# Patient Record
Sex: Male | Born: 1955 | Race: White | Hispanic: No | Marital: Married | Smoking: Former smoker
Health system: Southern US, Community
[De-identification: ages and names within clinical notes are randomized; demographics above are authoritative.]

## PROBLEM LIST (undated history)

## (undated) DIAGNOSIS — M1990 Unspecified osteoarthritis, unspecified site: Secondary | ICD-10-CM

## (undated) DIAGNOSIS — R0609 Other forms of dyspnea: Secondary | ICD-10-CM

## (undated) DIAGNOSIS — K219 Gastro-esophageal reflux disease without esophagitis: Secondary | ICD-10-CM

## (undated) DIAGNOSIS — G4730 Sleep apnea, unspecified: Secondary | ICD-10-CM

## (undated) DIAGNOSIS — R748 Abnormal levels of other serum enzymes: Secondary | ICD-10-CM

## (undated) DIAGNOSIS — I10 Essential (primary) hypertension: Secondary | ICD-10-CM

## (undated) HISTORY — PX: HAND SURGERY: SHX662

## (undated) HISTORY — PX: HERNIA REPAIR: SHX51

## (undated) HISTORY — PX: SHOULDER SURGERY: SHX246

## (undated) HISTORY — DX: Other forms of dyspnea: R06.09

---

## 1998-08-20 ENCOUNTER — Emergency Department (HOSPITAL_COMMUNITY): Admission: EM | Admit: 1998-08-20 | Discharge: 1998-08-20 | Payer: Self-pay | Admitting: Emergency Medicine

## 1999-07-14 ENCOUNTER — Encounter: Payer: Self-pay | Admitting: Orthopedic Surgery

## 1999-07-14 ENCOUNTER — Ambulatory Visit (HOSPITAL_COMMUNITY): Admission: RE | Admit: 1999-07-14 | Discharge: 1999-07-14 | Payer: Self-pay | Admitting: Orthopedic Surgery

## 1999-07-19 ENCOUNTER — Other Ambulatory Visit: Admission: RE | Admit: 1999-07-19 | Discharge: 1999-07-19 | Payer: Self-pay | Admitting: Orthopedic Surgery

## 2001-04-24 ENCOUNTER — Encounter: Payer: Self-pay | Admitting: Internal Medicine

## 2001-04-24 ENCOUNTER — Encounter: Admission: RE | Admit: 2001-04-24 | Discharge: 2001-04-24 | Payer: Self-pay | Admitting: Internal Medicine

## 2001-11-24 ENCOUNTER — Ambulatory Visit (HOSPITAL_COMMUNITY): Admission: RE | Admit: 2001-11-24 | Discharge: 2001-11-24 | Payer: Self-pay | Admitting: General Surgery

## 2003-09-13 ENCOUNTER — Ambulatory Visit (HOSPITAL_COMMUNITY): Admission: RE | Admit: 2003-09-13 | Discharge: 2003-09-13 | Payer: Self-pay | Admitting: Specialist

## 2004-08-02 ENCOUNTER — Ambulatory Visit (HOSPITAL_BASED_OUTPATIENT_CLINIC_OR_DEPARTMENT_OTHER): Admission: RE | Admit: 2004-08-02 | Discharge: 2004-08-02 | Payer: Self-pay | Admitting: Specialist

## 2009-03-30 ENCOUNTER — Encounter: Admission: RE | Admit: 2009-03-30 | Discharge: 2009-03-30 | Payer: Self-pay | Admitting: Gastroenterology

## 2009-05-09 ENCOUNTER — Ambulatory Visit (HOSPITAL_COMMUNITY): Admission: RE | Admit: 2009-05-09 | Discharge: 2009-05-09 | Payer: Self-pay | Admitting: Gastroenterology

## 2009-05-09 ENCOUNTER — Encounter (INDEPENDENT_AMBULATORY_CARE_PROVIDER_SITE_OTHER): Payer: Self-pay | Admitting: Diagnostic Radiology

## 2010-11-09 LAB — PROTIME-INR
INR: 0.9 (ref 0.00–1.49)
Prothrombin Time: 12.5 seconds (ref 11.6–15.2)

## 2010-11-09 LAB — CBC
HCT: 42.9 % (ref 39.0–52.0)
Hemoglobin: 15.1 g/dL (ref 13.0–17.0)
MCHC: 35.3 g/dL (ref 30.0–36.0)
MCV: 94.8 fL (ref 78.0–100.0)
Platelets: 211 10*3/uL (ref 150–400)
RBC: 4.52 MIL/uL (ref 4.22–5.81)
RDW: 12.6 % (ref 11.5–15.5)
WBC: 5.4 10*3/uL (ref 4.0–10.5)

## 2010-11-09 LAB — APTT: aPTT: 26 seconds (ref 24–37)

## 2010-12-22 NOTE — Op Note (Signed)
[place]. [facility]  Patient:    Blake Diaz, Blake Diaz Visit Number: [id number] MRN: [id number]          Service Type: DSU Location: [facility] 2893 [id number] Attending Physician:  Tempie Donning Dictated by:   Gita Kudo, M.D. Proc. Date: 11/24/01 Admit Date:  11/24/2001 Discharge Date: 11/24/2001   CC:         Verlin Grills, M.D.   Operative Report  PREOPERATIVE DIAGNOSIS:  Recurrent right inguinal hernia.  POSTOPERATIVE DIAGNOSIS:  Recurrent right inguinal hernia.  OPERATION PERFORMED:  Repair of recurrent right inguinal hernia, large complex direct, with preperitoneal and onlay Prolene mesh.  SURGEON:  Gita Kudo, M.D.  ANESTHESIA:  General endotracheal.  INDICATIONS FOR PROCEDURE:  The patient is a 55 year old male admitted for elective repair of a right inguinal hernia.  The hernia was repaired approximately 1994 with the initial surgery being several years earlier.  OPERATIVE FINDINGS:  The patient had a large at least bilobed right inguinal hernia that was direct.  It did have a sac.  The cord contents were actually in the subcutaneous and they were carefully dissected and the vessels and vas preserved.  DESCRIPTION OF PROCEDURE:  Under satisfactory general endotracheal anesthesia, the patients abdomen and genitalia were prepped and draped in a standard fashion.  He received 1.0 gm Ancef IV preop.  The previous incision was opened and carried down to the bulge.  Dissecting carefully through the subcutaneous we identified the cord and vas and dissected then free and encircled them with a Penrose drain.  The external ring was no longer evident but there was a large blow out of the entire floor of the canal.  The hernia sacs were dissected around from healthy fascia and then the floor of the canal opened from the internal ring and the pubic and the preperitoneal fat of both defects was reduced into the abdominal cavity.  The sac  was large, contained omentum and a small hole made in it.  It was felt best not to excise it but I just repaired the hole and inverted the sac along with the other preperitoneal contents.  Then a 3 x 6 inch piece of mesh was tailored into an oval and it was anchored to the Coopers ligament with a 0 Prolene suture and unfolded in the preperitoneal space after the gauze holding the contents away was removed. Then the floor was closed over this with a running 0 Prolene suture taking intermittent bites of the mesh.  The mesh extended from the pubis out laterally beyond the epigastric vessels.  The ends of the suture repairing the floor were left long.  Another piece of 3 x 6 inch mesh was then tailored into an oval.  The external oblique was dissected away from the internal oblique and the inguinal ligament identified.  I did not dissect deeply because there was some adherence and I did not wish to injure any vessels.  I did, however, envision the inguinal ligament just below the curving portion of the internal oblique aponeurosis.  The mesh then had a slit made to go around the cord structures and was anchored at the internal ring.  It was tacked around the periphery to the internal oblique above and the inguinal ligament below and then the tails brought around the cord and sutured to each other and the fascia above and laterally.  The wound was then lavaged with saline and then closed in layers after infiltration with 30  cc of Marcaine.  The external oblique was approximated with a running 2-0 Vicryl suture, deep fascia with interrupted 2-0 Vicryl, subcutaneous with 3-0 Vicryl and skin edges with Steri-Strips.  Sterile dressings were applied and the patient went to the recovery room from the operating room in good condition. Dictated by:   Gita Kudo, M.D. Attending Physician:  Tempie Donning DD:  11/24/01 TD:  11/24/01 Job: [id number]

## 2010-12-22 NOTE — Op Note (Signed)
Blake Diaz, Blake Diaz               ACCOUNT NO.:  [id number]   MEDICAL RECORD NO.:  [id number]          PATIENT TYPE:  AMB   LOCATION:  [facility]                         FACILITY:  [facility]   PHYSICIAN:  Jene Every, M.D.    DATE OF BIRTH:  22-Jun-1956   DATE OF PROCEDURE:  08/02/2004  DATE OF DISCHARGE:                                 OPERATIVE REPORT   PREOPERATIVE DIAGNOSIS:  Rotator cuff tear, acromioclavicular arthrosis,  impingement syndrome of the right shoulder.   POSTOPERATIVE DIAGNOSIS:  Rotator cuff tear, acromioclavicular arthrosis,  impingement syndrome of the right shoulder.   PROCEDURE PERFORMED:  Open acromioplasty, open distal clavicle resection,  open rotator cuff repair.   ANESTHESIA:  General.   ASSISTANT:  Roma Schanz, P.A.   BRIEF HISTORY/INDICATIONS:  A 55 year old with a rotator cuff tear,  impingement syndrome, AC arthrosis.  Operative intervention was indicated  for decompression of the [facility] joint, acromioplasty, repair of the rotator  cuff.  Risks and benefits were discussed, including bleeding, infection,  damage to vascular structures, suboptimal range of motion, need for  revision, postoperative mobilization, etc.   TECHNIQUE:  With the patient supine in the beach-chair position after the  induction of adequate general anesthesia and 1 gm of Kefzol, the right  shoulder and upper extremity was prepped and draped in the usual sterile  fashion.  An incision was made over the anterior aspect of the acromion and  along its lines, the subcutaneous tissue was dissected with electrocautery  utilized to achieve hemostasis.  The raphe between the anterior and lateral  heads of the deltoid was identified and divided, carried over the top of the  acromion, dividing the [facility] joint capsule.  This was then subperiosteally  elevated from the anterior and posterior aspect of the distal clavicle.  A  Bennett retractor was placed anteriorly and posteriorly, beneath the  distal  clavicle.  The oscillating saw was utilized to remove 1 cm of the distal  clavicle, and this was then undercut with a 3 mm Kerrison.  The [facility] joint was  debrided.  The rotator cuff inspected.  There was no evidence of tear, but  impingement was noted.  Following this, the wound was copiously irrigated  and placed bone wax on the distal clavicle.  Next, after we developed the  raphe between the anterior and lateral heads, a large anterior acromial spur  was noted.  The __________ ligament was detached.  The oscillating saw was  utilized and removed with the front lower portion of the acromion.  This was  further contoured with a high-speed bur, converting it into a type I  acromion.  Hypertrophic bursa was noted, and this was excised _________ the  CA ligament.  Inspection of the rotator cuff revealed a full thickness 1 cm  tear in the supraspinatus at its insertion.  This was debrided and excised.  The bone beneath it debrided.  Repaired it side to side with a #1 Vicryl and  figure-of-eight sutures.  No tension on the wound.  Good range of motion.  No impingement.  The remainder of the cuff  was without evidence of tear.  A  bursectomy was performed.  Next, the wound was copiously irrigated.  I  repaired the capsule over the [facility] joint with #1 Vicryl interrupted figure-of-  eight sutures.  Repaired the fascia and the raphe of the deltoid over the  top of the acromion with #1 interrupted figure-of-eight sutures with  excellent repair.  No tension on the wound.  This was after copious  irrigation of the wound.  Subcutaneous tissue was reapproximated with 2-0  Vicryl simple sutures.  Skin was reapproximated with 4-0 subcuticular  Prolene.  The wound was reinforced with Steri-Strips.  Sterile dressing  applied.  Placed in an abduction pillow, extubated without difficulty, and  transported to recovery room in satisfactory condition.  Patient tolerated  the procedure well.  No  complications.     Trey Paula   JB/MEDQ  D:  08/02/2004  T:  08/02/2004  Job:  [id number]

## 2011-08-07 ENCOUNTER — Emergency Department (HOSPITAL_BASED_OUTPATIENT_CLINIC_OR_DEPARTMENT_OTHER)
Admission: EM | Admit: 2011-08-07 | Discharge: 2011-08-07 | Disposition: A | Discharge status: Home / Self Care | Payer: Federal, State, Local not specified - PPO | Attending: Emergency Medicine | Admitting: Emergency Medicine

## 2011-08-07 ENCOUNTER — Encounter: Payer: Self-pay | Admitting: *Deleted

## 2011-08-07 DIAGNOSIS — R197 Diarrhea, unspecified: Secondary | ICD-10-CM | POA: Insufficient documentation

## 2011-08-07 DIAGNOSIS — Z79899 Other long term (current) drug therapy: Secondary | ICD-10-CM | POA: Insufficient documentation

## 2011-08-07 DIAGNOSIS — I10 Essential (primary) hypertension: Secondary | ICD-10-CM | POA: Insufficient documentation

## 2011-08-07 DIAGNOSIS — R1110 Vomiting, unspecified: Secondary | ICD-10-CM | POA: Insufficient documentation

## 2011-08-07 HISTORY — DX: Essential (primary) hypertension: I10

## 2011-08-07 LAB — BASIC METABOLIC PANEL
BUN: 23 mg/dL (ref 6–23)
CO2: 20 mEq/L (ref 19–32)
Calcium: 9.3 mg/dL (ref 8.4–10.5)
Chloride: 101 mEq/L (ref 96–112)
Creatinine, Ser: 0.9 mg/dL (ref 0.50–1.35)
GFR calc Af Amer: >90 mL/min (ref >90)
GFR calc non Af Amer: >90 mL/min (ref >90)
Glucose, Bld: 113 mg/dL — ABNORMAL HIGH (ref 70–99)
Potassium: 3.5 mEq/L (ref 3.5–5.1)
Sodium: 135 mEq/L (ref 135–145)

## 2011-08-07 MED ORDER — SODIUM CHLORIDE 0.9 % IV BOLUS (SEPSIS)
1000.0000 mL | Freq: Once | INTRAVENOUS | Status: AC
  Administered 2011-08-07: 1000 mL via INTRAVENOUS

## 2011-08-07 MED ORDER — PROMETHAZINE HCL 25 MG PO TABS: 25.0000 mg | ORAL_TABLET | Freq: Four times a day (QID) | ORAL | Status: AC | PRN

## 2011-08-07 NOTE — ED Provider Notes (Signed)
History     CSN: [id number]  Arrival date & time 08/07/11  1842   First MD Initiated Contact with Patient 08/07/11 1928      Chief Complaint  Patient presents with  . Emesis    (Consider location/radiation/quality/duration/timing/severity/associated sxs/prior treatment) HPI Comments: Pt states that he has had vomiting and diarrhea since yesterday:pt states that the vomiting is letting up but the diarrhea continues to be bad:pt states that he was seen at urgent care and they sent him over here for concern of dehydration:pt had a normal chest x-ray at the urgent care:pt was given phenergan while there  Patient is a 56 y.o. male presenting with vomiting. The history is provided by the patient. No language interpreter was used.  Emesis  This is a new problem. The current episode started yesterday. The problem occurs 5 to 10 times per day. The problem has not changed since onset.The emesis has an appearance of stomach contents. The maximum temperature recorded prior to his arrival was 100 to 100.9 F. Associated symptoms include abdominal pain, chills, cough, diarrhea and myalgias.    Past Medical History  Diagnosis Date  . Hypertension     Past Surgical History  Procedure Date  . Hernia repair   . Shoulder surgery   . Hand surgery     No family history on file.  History  Substance Use Topics  . Smoking status: Former Games developer  . Smokeless tobacco: Not on file  . Alcohol Use: No      Review of Systems  Constitutional: Positive for chills.  Respiratory: Positive for cough.   Gastrointestinal: Positive for vomiting, abdominal pain and diarrhea.  Musculoskeletal: Positive for myalgias.  All other systems reviewed and are negative.    Allergies  Review of patient's allergies indicates no known allergies.  Home Medications   Current Outpatient Rx  Name Route Sig Dispense Refill  . ACETAMINOPHEN 500 MG PO TABS Oral Take 1,000 mg by mouth every 6 (six) hours as needed.  For pain      . OMEGA-3 FATTY ACIDS 1000 MG PO CAPS Oral Take 1 g by mouth daily.      . IBUPROFEN 200 MG PO TABS Oral Take 400 mg by mouth every 6 (six) hours as needed. For pain      . LISINOPRIL 10 MG PO TABS Oral Take 10 mg by mouth daily.      . ADULT MULTIVITAMIN W/MINERALS CH Oral Take 1 tablet by mouth daily.      Marland Kitchen VITAMIN E 400 UNITS PO CAPS Oral Take 400 Units by mouth daily.        BP 112/70  Pulse 123  Temp(Src) 100.5 F (38.1 C) (Oral)  Resp 20  SpO2 99%  Physical Exam  Nursing note and vitals reviewed. Constitutional: He is oriented to person, place, and time. He appears well-developed and well-nourished.  HENT:  Right Ear: External ear normal.  Left Ear: External ear normal.  Mouth/Throat: Mucous membranes are dry.  Eyes: Conjunctivae and EOM are normal. Pupils are equal, round, and reactive to light.  Neck: Normal range of motion. Neck supple.  Cardiovascular: Normal rate and regular rhythm.   Pulmonary/Chest: Effort normal and breath sounds normal.  Abdominal: Soft. Bowel sounds are normal.  Musculoskeletal: Normal range of motion.  Neurological: He is alert and oriented to person, place, and time.  Skin: Skin is warm and dry.  Psychiatric: He has a normal mood and affect.    ED Course  Procedures (including  critical care time)  Labs Reviewed  BASIC METABOLIC PANEL - Abnormal; Notable for the following:    Glucose, Bld 113 (*)    All other components within normal limits   No results found.   1. Vomiting and diarrhea       MDM  Pt is feeling better after the 2 Liters of fluids:pt is tolerating po here:pt vital stable:symptoms likely viral:will treat for nausea        Teressa Lower, NP 08/07/11 2147

## 2011-08-07 NOTE — ED Notes (Signed)
Vomiting and diarrhea since yesterday am. Was seen earlier at [facility] and sent here for possible dehydration.

## 2011-08-08 NOTE — ED Provider Notes (Signed)
Medical screening examination/treatment/procedure(s) were performed by non-physician practitioner and as supervising physician I was immediately available for consultation/collaboration.   Vida Roller, MD 08/08/11 0300

## 2014-10-26 NOTE — Patient Instructions (Addendum)
[place]  10/26/2014   Your procedure is scheduled on:   11-08-2014 Monday  Enter through [facility]  Entrance and follow signs to [facility]. Arrive at   1100     AM.  Call this number if you have problems the morning of surgery: [phone]  Or Presurgical Testing [phone].   For Living Will and/or Health Care Power Attorney Forms: please provide copy for your medical record,may bring AM of surgery(Forms should be already notarized -we do not provide this service).(10-27-14 Yes/ will provide information on 11-08-14).   For Cpap use: Bring mask and tubing only.   Do not eat food/ or drink: After Midnight.  Exception: may have clear liquids:up to 6 Hours before arrival. Nothing after:  0800 AM.  Clear liquids include soda, tea, black coffee, apple or grape juice, broth.  Take these medicines the morning of surgery with A SIP OF WATER: (please check in With PCP if blood pressure med to be change)   Do not wear jewelry, make-up or nail polish.  Do not wear deodorant, lotions, powders, or perfumes.   Do not shave legs and under arms- 48 hours(2 days) prior to first CHG shower.(Shaving face and neck okay.)  Do not bring valuables to the hospital.(Hospital is not responsible for lost valuables).  Contacts, dentures or removable bridgework, body piercing, hair pins may not be worn into surgery.  Leave suitcase in the car. After surgery it may be brought to your room.  For patients admitted to the hospital, checkout time is 11:00 AM the day of discharge.(Restricted visitors-Any Persons displaying flu-like symptoms or illness).    Patients discharged the day of surgery will not be allowed to drive home. Must have responsible person with you x 24 hours once discharged.  Name and phone number of your driver: Blake Diaz [phone] cell     Please read over the following fact sheets that you were given:  CHG(Chlorhexidine Gluconate 4% Surgical Soap) use, MRSA Information,  Blood Transfusion fact sheet, Incentive Spirometry Instruction.  Remember : Type/Screen "Blue armbands" - may not be removed once applied(would result in being retested AM of surgery, if removed).         [facility] - Preparing for Surgery Before surgery, you can play an important role.  Because skin is not sterile, your skin needs to be as free of germs as possible.  You can reduce the number of germs on your skin by washing with CHG (chlorahexidine gluconate) soap before surgery.  CHG is an antiseptic cleaner which kills germs and bonds with the skin to continue killing germs even after washing. Please DO NOT use if you have an allergy to CHG or antibacterial soaps.  If your skin becomes reddened/irritated stop using the CHG and inform your nurse when you arrive at Short Stay. Do not shave (including legs and underarms) for at least 48 hours prior to the first CHG shower.  You may shave your face/neck. Please follow these instructions carefully:  1.  Shower with CHG Soap the night before surgery and the  morning of Surgery.  2.  If you choose to wash your hair, wash your hair first as usual with your  normal  shampoo.  3.  After you shampoo, rinse your hair and body thoroughly to remove the  shampoo.                           4.  Use  CHG as you would any other liquid soap.  You can apply chg directly  to the skin and wash                       Gently with a scrungie or clean washcloth.  5.  Apply the CHG Soap to your body ONLY FROM THE NECK DOWN.   Do not use on face/ open                           Wound or open sores. Avoid contact with eyes, ears mouth and genitals (private parts).                       Wash face,  Genitals (private parts) with your normal soap.             6.  Wash thoroughly, paying special attention to the area where your surgery  will be performed.  7.  Thoroughly rinse your body with warm water from the neck down.  8.  DO NOT shower/wash with your normal soap after  using and rinsing off  the CHG Soap.                9.  Pat yourself dry with a clean towel.            10.  Wear clean pajamas.            11.  Place clean sheets on your bed the night of your first shower and do not  sleep with pets. Day of Surgery : Do not apply any lotions/deodorants the morning of surgery.  Please wear clean clothes to the hospital/surgery center.  FAILURE TO FOLLOW THESE INSTRUCTIONS MAY RESULT IN THE CANCELLATION OF YOUR SURGERY PATIENT SIGNATURE_________________________________  NURSE SIGNATURE__________________________________  ________________________________________________________________________   Blake Diaz  An incentive spirometer is a tool that can help keep your lungs clear and active. This tool measures how well you are filling your lungs with each breath. Taking long deep breaths may help reverse or decrease the chance of developing breathing (pulmonary) problems (especially infection) following:  A long period of time when you are unable to move or be active. BEFORE THE PROCEDURE   If the spirometer includes an indicator to show your best effort, your nurse or respiratory therapist will set it to a desired goal.  If possible, sit up straight or lean slightly forward. Try not to slouch.  Hold the incentive spirometer in an upright position. INSTRUCTIONS FOR USE   Sit on the edge of your bed if possible, or sit up as far as you can in bed or on a chair.  Hold the incentive spirometer in an upright position.  Breathe out normally.  Place the mouthpiece in your mouth and seal your lips tightly around it.  Breathe in slowly and as deeply as possible, raising the piston or the ball toward the top of the column.  Hold your breath for 3-5 seconds or for as long as possible. Allow the piston or ball to fall to the bottom of the column.  Remove the mouthpiece from your mouth and breathe out normally.  Rest for a few seconds and repeat  Steps 1 through 7 at least 10 times every 1-2 hours when you are awake. Take your time and take a few normal breaths between deep breaths.  The spirometer may include an indicator to show your best  effort. Use the indicator as a goal to work toward during each repetition.  After each set of 10 deep breaths, practice coughing to be sure your lungs are clear. If you have an incision (the cut made at the time of surgery), support your incision when coughing by placing a pillow or rolled up towels firmly against it. Once you are able to get out of bed, walk around indoors and cough well. You may stop using the incentive spirometer when instructed by your caregiver.  RISKS AND COMPLICATIONS  Take your time so you do not get dizzy or light-headed.  If you are in pain, you may need to take or ask for pain medication before doing incentive spirometry. It is harder to take a deep breath if you are having pain. AFTER USE  Rest and breathe slowly and easily.  It can be helpful to keep track of a log of your progress. Your caregiver can provide you with a simple table to help with this. If you are using the spirometer at home, follow these instructions: [place] IF:   You are having difficultly using the spirometer.  You have trouble using the spirometer as often as instructed.  Your pain medication is not giving enough relief while using the spirometer.  You develop fever of 100.5 F (38.1 C) or higher. SEEK IMMEDIATE MEDICAL CARE IF:   You cough up bloody sputum that had not been present before.  You develop fever of 102 F (38.9 C) or greater.  You develop worsening pain at or near the incision site. MAKE SURE YOU:   Understand these instructions.  Will watch your condition.  Will get help right away if you are not doing well or get worse. Document Released: 12/03/2006 Document Revised: 10/15/2011 Document Reviewed: 02/03/2007 ExitCare Patient Information 2014 ExitCare,  [place].   ________________________________________________________________________  WHAT IS A BLOOD TRANSFUSION? Blood Transfusion Information  A transfusion is the replacement of blood or some of its parts. Blood is made up of multiple cells which provide different functions.  Red blood cells carry oxygen and are used for blood loss replacement.  Lieder blood cells fight against infection.  Platelets control bleeding.  Plasma helps clot blood.  Other blood products are available for specialized needs, such as hemophilia or other clotting disorders. BEFORE THE TRANSFUSION  Who gives blood for transfusions?   Healthy volunteers who are fully evaluated to make sure their blood is safe. This is blood bank blood. Transfusion therapy is the safest it has ever been in the practice of medicine. Before blood is taken from a donor, a complete history is taken to make sure that person has no history of diseases nor engages in risky social behavior (examples are intravenous drug use or sexual activity with multiple partners). The donor's travel history is screened to minimize risk of transmitting infections, such as malaria. The donated blood is tested for signs of infectious diseases, such as HIV and hepatitis. The blood is then tested to be sure it is compatible with you in order to minimize the chance of a transfusion reaction. If you or a relative donates blood, this is often done in anticipation of surgery and is not appropriate for emergency situations. It takes many days to process the donated blood. RISKS AND COMPLICATIONS Although transfusion therapy is very safe and saves many lives, the main dangers of transfusion include:   Getting an infectious disease.  Developing a transfusion reaction. This is an allergic reaction to something in the  blood you were given. Every precaution is taken to prevent this. The decision to have a blood transfusion has been considered carefully by your caregiver  before blood is given. Blood is not given unless the benefits outweigh the risks. AFTER THE TRANSFUSION  Right after receiving a blood transfusion, you will usually feel much better and more energetic. This is especially true if your red blood cells have gotten low (anemic). The transfusion raises the level of the red blood cells which carry oxygen, and this usually causes an energy increase.  The nurse administering the transfusion will monitor you carefully for complications. HOME CARE INSTRUCTIONS  No special instructions are needed after a transfusion. You may find your energy is better. Speak with your caregiver about any limitations on activity for underlying diseases you may have. SEEK MEDICAL CARE IF:   Your condition is not improving after your transfusion.  You develop redness or irritation at the intravenous (IV) site. SEEK IMMEDIATE MEDICAL CARE IF:  Any of the following symptoms occur over the next 12 hours:  Shaking chills.  You have a temperature by mouth above 102 F (38.9 C), not controlled by medicine.  Chest, back, or muscle pain.  People around you feel you are not acting correctly or are confused.  Shortness of breath or difficulty breathing.  Dizziness and fainting.  You get a rash or develop hives.  You have a decrease in urine output.  Your urine turns a dark color or changes to pink, red, or brown. Any of the following symptoms occur over the next 10 days:  You have a temperature by mouth above 102 F (38.9 C), not controlled by medicine.  Shortness of breath.  Weakness after normal activity.  The [place] part of the eye turns yellow (jaundice).  You have a decrease in the amount of urine or are urinating less often.  Your urine turns a dark color or changes to pink, red, or brown. Document Released: 07/20/2000 Document Revised: 10/15/2011 Document Reviewed: 03/08/2008 [facility] Patient Information 2014 [place],  [place].  _______________________________________________________________________

## 2014-10-26 NOTE — Progress Notes (Signed)
Please put orders in Epic surgery 11-08-14 pre op 10-27-14 Thanks

## 2014-10-27 ENCOUNTER — Encounter (HOSPITAL_COMMUNITY)
Admission: RE | Admit: 2014-10-27 | Discharge: 2014-10-27 | Disposition: A | Discharge status: Home / Self Care | Payer: Federal, State, Local not specified - PPO | Source: Ambulatory Visit | Attending: Orthopedic Surgery | Admitting: Orthopedic Surgery

## 2014-10-27 ENCOUNTER — Encounter (HOSPITAL_COMMUNITY): Payer: Self-pay

## 2014-10-27 DIAGNOSIS — Z01810 Encounter for preprocedural cardiovascular examination: Secondary | ICD-10-CM | POA: Insufficient documentation

## 2014-10-27 DIAGNOSIS — Z01812 Encounter for preprocedural laboratory examination: Secondary | ICD-10-CM | POA: Insufficient documentation

## 2014-10-27 HISTORY — DX: Abnormal levels of other serum enzymes: R74.8

## 2014-10-27 HISTORY — DX: Sleep apnea, unspecified: G47.30

## 2014-10-27 HISTORY — DX: Gastro-esophageal reflux disease without esophagitis: K21.9

## 2014-10-27 HISTORY — DX: Unspecified osteoarthritis, unspecified site: M19.90

## 2014-10-27 LAB — COMPREHENSIVE METABOLIC PANEL
ALT: 54 U/L — ABNORMAL HIGH (ref 0–53)
AST: 38 U/L — ABNORMAL HIGH (ref 0–37)
Albumin: 4.7 g/dL (ref 3.5–5.2)
Alkaline Phosphatase: 61 U/L (ref 39–117)
Anion gap: 8 (ref 5–15)
BUN: 9 mg/dL (ref 6–23)
CO2: 26 mmol/L (ref 19–32)
Calcium: 9.5 mg/dL (ref 8.4–10.5)
Chloride: 105 mmol/L (ref 96–112)
Creatinine, Ser: 0.78 mg/dL (ref 0.50–1.35)
GFR calc Af Amer: >90 mL/min (ref >90)
GFR calc non Af Amer: >90 mL/min (ref >90)
Glucose, Bld: 104 mg/dL — ABNORMAL HIGH (ref 70–99)
Potassium: 4.1 mmol/L (ref 3.5–5.1)
Sodium: 139 mmol/L (ref 135–145)
Total Bilirubin: 1.1 mg/dL (ref 0.3–1.2)
Total Protein: 7.5 g/dL (ref 6.0–8.3)

## 2014-10-27 LAB — SURGICAL PCR SCREEN
MRSA, PCR: NEGATIVE
Staphylococcus aureus: NEGATIVE

## 2014-10-27 LAB — CBC
HCT: 43.6 % (ref 39.0–52.0)
Hemoglobin: 15.2 g/dL (ref 13.0–17.0)
MCH: 32.6 pg (ref 26.0–34.0)
MCHC: 34.9 g/dL (ref 30.0–36.0)
MCV: 93.6 fL (ref 78.0–100.0)
Platelets: 245 10*3/uL (ref 150–400)
RBC: 4.66 MIL/uL (ref 4.22–5.81)
RDW: 12.3 % (ref 11.5–15.5)
WBC: 6.5 10*3/uL (ref 4.0–10.5)

## 2014-10-27 LAB — PROTIME-INR
INR: 0.97 (ref 0.00–1.49)
Prothrombin Time: 13 seconds (ref 11.6–15.2)

## 2014-10-27 LAB — APTT: aPTT: 30 seconds (ref 24–37)

## 2014-10-27 NOTE — Pre-Procedure Instructions (Signed)
10-27-14 Pt. Not sure whether he wants to continue present blood pressure med, instructed to call PCP to discuss med use, inform me if there is a change in med.

## 2014-10-27 NOTE — Progress Notes (Signed)
10-27-14 Need MD order entry in [place] here now for PAT appointment.

## 2014-11-03 NOTE — H&P (Signed)
TOTAL KNEE ADMISSION H&P  Patient is being admitted for right total knee arthroplasty.  Subjective:  Chief Complaint:    Right knee primary OA / pain.  HPI: KENTRELL HALLAHAN, 59 y.o. male, has a history of pain and functional disability in the right knee due to arthritis and has failed non-surgical conservative treatments for greater than 12 weeks to include NSAID's and/or analgesics, corticosteriod injections, viscosupplementation injections and activity modification.  Onset of symptoms was gradual, starting years ago with gradually worsening course since that time. The patient noted no past surgery on the right knee(s).  Patient currently rates pain in the right knee(s) at 8 out of 10 with activity. Patient has night pain, worsening of pain with activity and weight bearing, pain that interferes with activities of daily living, pain with passive range of motion, crepitus and joint swelling.  Patient has evidence of periarticular osteophytes and joint space narrowing by imaging studies.  There is no active infection.  Risks, benefits and expectations were discussed with the patient.  Risks including but not limited to the risk of anesthesia, blood clots, nerve damage, blood vessel damage, failure of the prosthesis, infection and up to and including death.  Patient understand the risks, benefits and expectations and wishes to proceed with surgery.   PCP: No PCP Per Patient  D/C Plans:     Home with HHPT  Post-op Meds:       No Rx given   Tranexamic Acid:      To be given - IV    Decadron:      is to be given  FYI:     ASA post-op  Norco post-op   CPAP   Past Medical History  Diagnosis Date  . Hypertension     presently taking Lisinopril sparingly  . Sleep apnea     nasal prongs, cpap used occ.  Marland Kitchen GERD (gastroesophageal reflux disease)     acid reflux uses OTS "TUMS"  . Arthritis     osteoarthritis right knee  . Elevated liver enzymes     history of "never any continued follow  up"-"not a problem"    Past Surgical History  Procedure Laterality Date  . Shoulder surgery Right     open -bone spur and ligament tears"some limited ROM"  . Hand surgery Left     trigger finger  . Hernia repair      Umbilical, bilateral inguinal hernis repair    No prescriptions prior to admission   No Known Allergies   History  Substance Use Topics  . Smoking status: Former Smoker    Types: Cigarettes    Quit date: 10/27/1994  . Smokeless tobacco: Not on file  . Alcohol Use: No    No family history on file.   Review of Systems  Constitutional: Negative.   HENT: Negative.   Eyes: Negative.   Respiratory: Negative.   Cardiovascular: Negative.   Gastrointestinal: Positive for heartburn.  Genitourinary: Negative.   Musculoskeletal: Positive for joint pain.  Skin: Negative.   Neurological: Negative.   Endo/Heme/Allergies: Negative.   Psychiatric/Behavioral: Negative.     Objective:  Physical Exam  Constitutional: He is oriented to person, place, and time. He appears well-developed and well-nourished.  HENT:  Head: Normocephalic.  Neck: Neck supple. No JVD present. No tracheal deviation present. No thyromegaly present.  Cardiovascular: Normal rate, regular rhythm, normal heart sounds and intact distal pulses.   Respiratory: Effort normal and breath sounds normal. No stridor. No respiratory distress. He has  no wheezes.  GI: Soft. There is no tenderness. There is no guarding.  Musculoskeletal:       Right knee: He exhibits decreased range of motion, swelling and bony tenderness. He exhibits no ecchymosis, no deformity, no laceration and no erythema. Tenderness found.  Lymphadenopathy:    He has no cervical adenopathy.  Neurological: He is alert and oriented to person, place, and time.  Skin: Skin is warm and dry.  Psychiatric: He has a normal mood and affect.      Imaging Review Plain radiographs demonstrate severe degenerative joint disease of the right  knee(s). The overall alignment is neutral. The bone quality appears to be good for age and reported activity level.  Assessment/Plan:  End stage arthritis, right knee   The patient history, physical examination, clinical judgment of the provider and imaging studies are consistent with end stage degenerative joint disease of the right knee(s) and total knee arthroplasty is deemed medically necessary. The treatment options including medical management, injection therapy arthroscopy and arthroplasty were discussed at length. The risks and benefits of total knee arthroplasty were presented and reviewed. The risks due to aseptic loosening, infection, stiffness, patella tracking problems, thromboembolic complications and other imponderables were discussed. The patient acknowledged the explanation, agreed to proceed with the plan and consent was signed. Patient is being admitted for inpatient treatment for surgery, pain control, PT, OT, prophylactic antibiotics, VTE prophylaxis, progressive ambulation and ADL's and discharge planning. The patient is planning to be discharged home with home health services.     [place] Sarina Robleto   PA-C  11/03/2014, 9:33 PM

## 2014-11-08 ENCOUNTER — Encounter (HOSPITAL_COMMUNITY): Payer: Self-pay | Admitting: *Deleted

## 2014-11-08 ENCOUNTER — Encounter (HOSPITAL_COMMUNITY)
Admission: RE | Disposition: A | Discharge status: Home Health | Payer: Self-pay | Source: Ambulatory Visit | Attending: Orthopedic Surgery

## 2014-11-08 ENCOUNTER — Inpatient Hospital Stay (HOSPITAL_COMMUNITY): Payer: Federal, State, Local not specified - PPO | Admitting: Anesthesiology

## 2014-11-08 ENCOUNTER — Inpatient Hospital Stay (HOSPITAL_COMMUNITY)
Admission: RE | Admit: 2014-11-08 | Discharge: 2014-11-09 | DRG: 470 | Disposition: A | Discharge status: Home Health | Payer: Federal, State, Local not specified - PPO | Source: Ambulatory Visit | Attending: Orthopedic Surgery | Admitting: Orthopedic Surgery

## 2014-11-08 DIAGNOSIS — M25561 Pain in right knee: Secondary | ICD-10-CM | POA: Diagnosis present

## 2014-11-08 DIAGNOSIS — G4730 Sleep apnea, unspecified: Secondary | ICD-10-CM | POA: Diagnosis present

## 2014-11-08 DIAGNOSIS — K219 Gastro-esophageal reflux disease without esophagitis: Secondary | ICD-10-CM | POA: Diagnosis present

## 2014-11-08 DIAGNOSIS — I10 Essential (primary) hypertension: Secondary | ICD-10-CM | POA: Diagnosis present

## 2014-11-08 DIAGNOSIS — M1711 Unilateral primary osteoarthritis, right knee: Secondary | ICD-10-CM | POA: Diagnosis present

## 2014-11-08 DIAGNOSIS — Z96651 Presence of right artificial knee joint: Secondary | ICD-10-CM

## 2014-11-08 DIAGNOSIS — Z01812 Encounter for preprocedural laboratory examination: Secondary | ICD-10-CM | POA: Diagnosis not present

## 2014-11-08 DIAGNOSIS — M659 Synovitis and tenosynovitis, unspecified: Secondary | ICD-10-CM | POA: Diagnosis present

## 2014-11-08 DIAGNOSIS — Z96659 Presence of unspecified artificial knee joint: Secondary | ICD-10-CM

## 2014-11-08 DIAGNOSIS — Z87891 Personal history of nicotine dependence: Secondary | ICD-10-CM | POA: Diagnosis not present

## 2014-11-08 HISTORY — PX: TOTAL KNEE ARTHROPLASTY: SHX125

## 2014-11-08 LAB — TYPE AND SCREEN
ABO/RH(D): O POS
Antibody Screen: NEGATIVE

## 2014-11-08 LAB — URINALYSIS, ROUTINE W REFLEX MICROSCOPIC
Bilirubin Urine: NEGATIVE
Glucose, UA: NEGATIVE mg/dL
Hgb urine dipstick: NEGATIVE
Ketones, ur: NEGATIVE mg/dL
Nitrite: NEGATIVE
Protein, ur: NEGATIVE mg/dL
Specific Gravity, Urine: 1.024 (ref 1.005–1.030)
Urobilinogen, UA: 1 mg/dL (ref 0.0–1.0)
pH: 7 (ref 5.0–8.0)

## 2014-11-08 LAB — URINE MICROSCOPIC-ADD ON

## 2014-11-08 LAB — ABO/RH: ABO/RH(D): O POS

## 2014-11-08 SURGERY — ARTHROPLASTY, KNEE, TOTAL
Anesthesia: Spinal | Site: Knee | Laterality: Right

## 2014-11-08 MED ORDER — HYDROMORPHONE HCL 1 MG/ML IJ SOLN: 0.2500 mg | INTRAMUSCULAR | Status: DC | PRN

## 2014-11-08 MED ORDER — CHLORHEXIDINE GLUCONATE 4 % EX LIQD: 60.0000 mL | Freq: Once | CUTANEOUS | Status: DC

## 2014-11-08 MED ORDER — FERROUS SULFATE 325 (65 FE) MG PO TABS
325.0000 mg | ORAL_TABLET | Freq: Three times a day (TID) | ORAL | Status: DC
  Administered 2014-11-08 – 2014-11-09 (×2): 325 mg via ORAL
  Filled 2014-11-08 (×5): qty 1

## 2014-11-08 MED ORDER — METOCLOPRAMIDE HCL 10 MG PO TABS
5.0000 mg | ORAL_TABLET | Freq: Three times a day (TID) | ORAL | Status: DC | PRN
Start: 2014-11-08 — End: 2014-11-09

## 2014-11-08 MED ORDER — PHENYLEPHRINE 40 MCG/ML (10ML) SYRINGE FOR IV PUSH (FOR BLOOD PRESSURE SUPPORT)
PREFILLED_SYRINGE | INTRAVENOUS | Status: AC
  Filled 2014-11-08: qty 10

## 2014-11-08 MED ORDER — LACTATED RINGERS IV SOLN
INTRAVENOUS | Status: DC
  Administered 2014-11-08 (×2): via INTRAVENOUS
  Administered 2014-11-08: 1000 mL via INTRAVENOUS
  Administered 2014-11-08: 15:00:00 via INTRAVENOUS

## 2014-11-08 MED ORDER — SODIUM CHLORIDE 0.9 % IR SOLN
Status: DC | PRN
  Administered 2014-11-08: 1000 mL

## 2014-11-08 MED ORDER — SODIUM CHLORIDE 0.9 % IV SOLN
1000.0000 mg | Freq: Once | INTRAVENOUS | Status: AC
  Administered 2014-11-08: 1000 mg via INTRAVENOUS
  Filled 2014-11-08: qty 10

## 2014-11-08 MED ORDER — HYDROCODONE-ACETAMINOPHEN 7.5-325 MG PO TABS
1.0000 | ORAL_TABLET | ORAL | Status: DC
Start: 2014-11-08 — End: 2014-11-09
  Administered 2014-11-08 (×2): 1 via ORAL
  Administered 2014-11-09 (×3): 2 via ORAL
  Filled 2014-11-08 (×3): qty 2
  Filled 2014-11-08 (×2): qty 1

## 2014-11-08 MED ORDER — CEFAZOLIN SODIUM-DEXTROSE 2-3 GM-% IV SOLR
2.0000 g | INTRAVENOUS | Status: AC
  Administered 2014-11-08: 2 g via INTRAVENOUS

## 2014-11-08 MED ORDER — LACTATED RINGERS IV SOLN: INTRAVENOUS | Status: DC

## 2014-11-08 MED ORDER — POLYETHYLENE GLYCOL 3350 17 G PO PACK
17.0000 g | PACK | Freq: Two times a day (BID) | ORAL | Status: DC
  Administered 2014-11-08 – 2014-11-09 (×2): 17 g via ORAL

## 2014-11-08 MED ORDER — FENTANYL CITRATE 0.05 MG/ML IJ SOLN
INTRAMUSCULAR | Status: DC | PRN
  Administered 2014-11-08: 100 ug via INTRAVENOUS

## 2014-11-08 MED ORDER — HYDROMORPHONE HCL 1 MG/ML IJ SOLN
0.5000 mg | INTRAMUSCULAR | Status: DC | PRN
  Administered 2014-11-08: 1 mg via INTRAVENOUS
  Filled 2014-11-08: qty 1

## 2014-11-08 MED ORDER — METHOCARBAMOL 500 MG PO TABS: 500.0000 mg | ORAL_TABLET | Freq: Four times a day (QID) | ORAL | Status: DC | PRN

## 2014-11-08 MED ORDER — MIDAZOLAM HCL 2 MG/2ML IJ SOLN
INTRAMUSCULAR | Status: AC
  Filled 2014-11-08: qty 2

## 2014-11-08 MED ORDER — CELECOXIB 200 MG PO CAPS
200.0000 mg | ORAL_CAPSULE | Freq: Two times a day (BID) | ORAL | Status: DC
  Administered 2014-11-08 – 2014-11-09 (×2): 200 mg via ORAL
  Filled 2014-11-08 (×2): qty 1

## 2014-11-08 MED ORDER — CEFAZOLIN SODIUM-DEXTROSE 2-3 GM-% IV SOLR
2.0000 g | Freq: Four times a day (QID) | INTRAVENOUS | Status: AC
  Administered 2014-11-08 – 2014-11-09 (×2): 2 g via INTRAVENOUS
  Filled 2014-11-08 (×2): qty 50

## 2014-11-08 MED ORDER — SODIUM CHLORIDE 0.9 % IJ SOLN
INTRAMUSCULAR | Status: DC | PRN
  Administered 2014-11-08 (×2): 30 mL

## 2014-11-08 MED ORDER — BISACODYL 10 MG RE SUPP: 10.0000 mg | Freq: Every day | RECTAL | Status: DC | PRN

## 2014-11-08 MED ORDER — PROPOFOL 10 MG/ML IV BOLUS
INTRAVENOUS | Status: AC
  Filled 2014-11-08: qty 20

## 2014-11-08 MED ORDER — ONDANSETRON HCL 4 MG PO TABS
4.0000 mg | ORAL_TABLET | Freq: Four times a day (QID) | ORAL | Status: DC | PRN
Start: 2014-11-08 — End: 2014-11-09

## 2014-11-08 MED ORDER — PHENYLEPHRINE HCL 10 MG/ML IJ SOLN
INTRAMUSCULAR | Status: DC | PRN
  Administered 2014-11-08: 80 ug via INTRAVENOUS

## 2014-11-08 MED ORDER — ASPIRIN EC 325 MG PO TBEC
325.0000 mg | DELAYED_RELEASE_TABLET | Freq: Two times a day (BID) | ORAL | Status: DC
  Administered 2014-11-09: 325 mg via ORAL
  Filled 2014-11-08 (×3): qty 1

## 2014-11-08 MED ORDER — DIPHENHYDRAMINE HCL 25 MG PO CAPS: 25.0000 mg | ORAL_CAPSULE | Freq: Four times a day (QID) | ORAL | Status: DC | PRN

## 2014-11-08 MED ORDER — BUPIVACAINE IN DEXTROSE 0.75-8.25 % IT SOLN
INTRATHECAL | Status: DC | PRN
  Administered 2014-11-08: 2 mL via INTRATHECAL

## 2014-11-08 MED ORDER — MIDAZOLAM HCL 5 MG/5ML IJ SOLN
INTRAMUSCULAR | Status: DC | PRN
  Administered 2014-11-08: 2 mg via INTRAVENOUS

## 2014-11-08 MED ORDER — ALUM & MAG HYDROXIDE-SIMETH 200-200-20 MG/5ML PO SUSP: 30.0000 mL | ORAL | Status: DC | PRN

## 2014-11-08 MED ORDER — 0.9 % SODIUM CHLORIDE (POUR BTL) OPTIME
TOPICAL | Status: DC | PRN
  Administered 2014-11-08: 1000 mL

## 2014-11-08 MED ORDER — KETOROLAC TROMETHAMINE 30 MG/ML IJ SOLN
INTRAMUSCULAR | Status: DC | PRN
  Administered 2014-11-08 (×2): 30 mg

## 2014-11-08 MED ORDER — METOCLOPRAMIDE HCL 5 MG/ML IJ SOLN: 5.0000 mg | Freq: Three times a day (TID) | INTRAMUSCULAR | Status: DC | PRN

## 2014-11-08 MED ORDER — PROPOFOL INFUSION 10 MG/ML OPTIME
INTRAVENOUS | Status: DC | PRN
  Administered 2014-11-08: 100 ug/kg/min via INTRAVENOUS

## 2014-11-08 MED ORDER — DEXTROSE 5 % IV SOLN
500.0000 mg | Freq: Four times a day (QID) | INTRAVENOUS | Status: DC | PRN
  Filled 2014-11-08: qty 5

## 2014-11-08 MED ORDER — DEXAMETHASONE SODIUM PHOSPHATE 10 MG/ML IJ SOLN: 10.0000 mg | Freq: Once | INTRAMUSCULAR | Status: DC

## 2014-11-08 MED ORDER — FENTANYL CITRATE 0.05 MG/ML IJ SOLN
INTRAMUSCULAR | Status: AC
  Filled 2014-11-08: qty 2

## 2014-11-08 MED ORDER — MENTHOL 3 MG MT LOZG: 1.0000 | LOZENGE | OROMUCOSAL | Status: DC | PRN

## 2014-11-08 MED ORDER — PHENOL 1.4 % MT LIQD: 1.0000 | OROMUCOSAL | Status: DC | PRN

## 2014-11-08 MED ORDER — SODIUM CHLORIDE 0.9 % IJ SOLN
INTRAMUSCULAR | Status: AC
  Filled 2014-11-08: qty 50

## 2014-11-08 MED ORDER — KETOROLAC TROMETHAMINE 30 MG/ML IJ SOLN
INTRAMUSCULAR | Status: AC
  Filled 2014-11-08: qty 1

## 2014-11-08 MED ORDER — MAGNESIUM CITRATE PO SOLN: 1.0000 | Freq: Once | ORAL | Status: AC | PRN

## 2014-11-08 MED ORDER — DOCUSATE SODIUM 100 MG PO CAPS
100.0000 mg | ORAL_CAPSULE | Freq: Two times a day (BID) | ORAL | Status: DC
  Administered 2014-11-08 – 2014-11-09 (×2): 100 mg via ORAL

## 2014-11-08 MED ORDER — ONDANSETRON HCL 4 MG/2ML IJ SOLN: 4.0000 mg | Freq: Four times a day (QID) | INTRAMUSCULAR | Status: DC | PRN

## 2014-11-08 MED ORDER — BUPIVACAINE-EPINEPHRINE (PF) 0.25% -1:200000 IJ SOLN
INTRAMUSCULAR | Status: DC | PRN
  Administered 2014-11-08 (×2): 30 mL

## 2014-11-08 MED ORDER — DEXAMETHASONE SODIUM PHOSPHATE 10 MG/ML IJ SOLN
10.0000 mg | Freq: Once | INTRAMUSCULAR | Status: AC
  Administered 2014-11-09: 10 mg via INTRAVENOUS
  Filled 2014-11-08: qty 1

## 2014-11-08 MED ORDER — CEFAZOLIN SODIUM-DEXTROSE 2-3 GM-% IV SOLR
INTRAVENOUS | Status: AC
  Filled 2014-11-08: qty 50

## 2014-11-08 MED ORDER — SODIUM CHLORIDE 0.9 % IV SOLN
INTRAVENOUS | Status: DC
  Administered 2014-11-08: 17:00:00 via INTRAVENOUS
  Filled 2014-11-08 (×7): qty 1000

## 2014-11-08 MED ORDER — BUPIVACAINE-EPINEPHRINE (PF) 0.25% -1:200000 IJ SOLN
INTRAMUSCULAR | Status: AC
  Filled 2014-11-08: qty 30

## 2014-11-08 SURGICAL SUPPLY — 53 items
BAG DECANTER FOR FLEXI CONT (MISCELLANEOUS) IMPLANT
BAG ZIPLOCK 12X15 (MISCELLANEOUS) IMPLANT
BANDAGE ELASTIC 6 VELCRO ST LF (GAUZE/BANDAGES/DRESSINGS) ×2 IMPLANT
BANDAGE ESMARK 6X9 LF (GAUZE/BANDAGES/DRESSINGS) ×1 IMPLANT
BLADE SAW SGTL 13.0X1.19X90.0M (BLADE) ×2 IMPLANT
BNDG ESMARK 6X9 LF (GAUZE/BANDAGES/DRESSINGS) ×2
BOWL SMART MIX CTS (DISPOSABLE) ×2 IMPLANT
CAPT KNEE TOTAL 3 ATTUNE ×2 IMPLANT
CEMENT HV SMART SET (Cement) ×4 IMPLANT
CUFF TOURN SGL QUICK 34 (TOURNIQUET CUFF) ×1
CUFF TRNQT CYL 34X4X40X1 (TOURNIQUET CUFF) ×1 IMPLANT
DECANTER SPIKE VIAL GLASS SM (MISCELLANEOUS) ×2 IMPLANT
DRAPE EXTREMITY T 121X128X90 (DRAPE) ×2 IMPLANT
DRAPE POUCH INSTRU U-SHP 10X18 (DRAPES) ×2 IMPLANT
DRAPE U-SHAPE 47X51 STRL (DRAPES) ×2 IMPLANT
DRSG AQUACEL AG ADV 3.5X10 (GAUZE/BANDAGES/DRESSINGS) ×2 IMPLANT
DURAPREP 26ML APPLICATOR (WOUND CARE) ×4 IMPLANT
ELECT REM PT RETURN 9FT ADLT (ELECTROSURGICAL) ×2
ELECTRODE REM PT RTRN 9FT ADLT (ELECTROSURGICAL) ×1 IMPLANT
FACESHIELD WRAPAROUND (MASK) ×10 IMPLANT
GLOVE BIOGEL PI IND STRL 7.5 (GLOVE) ×1 IMPLANT
GLOVE BIOGEL PI IND STRL 8.5 (GLOVE) ×1 IMPLANT
GLOVE BIOGEL PI INDICATOR 7.5 (GLOVE) ×1
GLOVE BIOGEL PI INDICATOR 8.5 (GLOVE) ×1
GLOVE ECLIPSE 8.0 STRL XLNG CF (GLOVE) ×2 IMPLANT
GLOVE ORTHO TXT STRL SZ7.5 (GLOVE) ×4 IMPLANT
GOWN SPEC L3 XXLG W/TWL (GOWN DISPOSABLE) ×2 IMPLANT
GOWN STRL REUS W/TWL LRG LVL3 (GOWN DISPOSABLE) ×2 IMPLANT
HANDPIECE INTERPULSE COAX TIP (DISPOSABLE) ×1
KIT BASIN OR (CUSTOM PROCEDURE TRAY) ×2 IMPLANT
LIQUID BAND (GAUZE/BANDAGES/DRESSINGS) ×2 IMPLANT
MANIFOLD NEPTUNE II (INSTRUMENTS) ×2 IMPLANT
NDL SAFETY ECLIPSE 18X1.5 (NEEDLE) ×1 IMPLANT
NEEDLE HYPO 18GX1.5 SHARP (NEEDLE) ×1
PACK TOTAL JOINT (CUSTOM PROCEDURE TRAY) ×2 IMPLANT
PEN SKIN MARKING BROAD (MISCELLANEOUS) ×2 IMPLANT
POSITIONER SURGICAL ARM (MISCELLANEOUS) ×2 IMPLANT
SET HNDPC FAN SPRY TIP SCT (DISPOSABLE) ×1 IMPLANT
SET PAD KNEE POSITIONER (MISCELLANEOUS) ×2 IMPLANT
SUCTION FRAZIER 12FR DISP (SUCTIONS) ×2 IMPLANT
SUT MNCRL AB 4-0 PS2 18 (SUTURE) ×2 IMPLANT
SUT VIC AB 1 CT1 36 (SUTURE) ×2 IMPLANT
SUT VIC AB 2-0 CT1 27 (SUTURE) ×3
SUT VIC AB 2-0 CT1 TAPERPNT 27 (SUTURE) ×3 IMPLANT
SUT VLOC 180 0 24IN GS25 (SUTURE) ×2 IMPLANT
SYR 50ML LL SCALE MARK (SYRINGE) ×2 IMPLANT
TOWEL OR 17X26 10 PK STRL BLUE (TOWEL DISPOSABLE) ×2 IMPLANT
TOWEL OR NON WOVEN STRL DISP B (DISPOSABLE) IMPLANT
TRAY FOLEY CATH 14FRSI W/METER (CATHETERS) IMPLANT
TRAY FOLEY CATH 16FR SILVER (SET/KITS/TRAYS/PACK) ×2 IMPLANT
WATER STERILE IRR 1500ML POUR (IV SOLUTION) ×2 IMPLANT
WRAP KNEE MAXI GEL POST OP (GAUZE/BANDAGES/DRESSINGS) ×2 IMPLANT
YANKAUER SUCT BULB TIP 10FT TU (MISCELLANEOUS) ×2 IMPLANT

## 2014-11-08 NOTE — Anesthesia Procedure Notes (Signed)
Spinal Patient location during procedure: OR Start time: 11/08/2014 1:41 PM Staffing Anesthesiologist: Rod Mae Resident/CRNA: [place] Domino Holten C Performed by: resident/CRNA  Preanesthetic Checklist Completed: patient identified, site marked, surgical consent, pre-op evaluation, timeout performed, IV checked, risks and benefits discussed and monitors and equipment checked Spinal Block Patient position: sitting Prep: Betadine and site prepped and draped Patient monitoring: heart rate, cardiac monitor, continuous pulse ox and blood pressure Approach: midline Location: L3-4 Injection technique: single-shot Needle Needle type: Sprotte  Needle gauge: 24 G Assessment Sensory level: T6 Additional Notes Neg heme, patient tolerated well

## 2014-11-08 NOTE — Op Note (Signed)
NAME:  Blake Diaz                      MEDICAL RECORD NO.:  [id number]                             FACILITY:  [facility]      PHYSICIAN:  Pietro Cassis. [name], M.D.  DATE OF BIRTH:  Oct 31, 1955      DATE OF PROCEDURE:  11/08/2014                                     OPERATIVE REPORT         PREOPERATIVE DIAGNOSIS:  Right knee osteoarthritis.      POSTOPERATIVE DIAGNOSIS:  Right knee osteoarthritis.      FINDINGS:  The patient was noted to have complete loss of cartilage and   bone-on-bone arthritis with associated osteophytes in the medial and patellofemoral compartments of   the knee with a significant synovitis and associated effusion.      PROCEDURE:  Right total knee replacement.      COMPONENTS USED:  DePuy Attune rotating platform posterior stabilized knee   system, a size 6 femur, & tibia, 8 mm PS AOX insert, and 41 anatomic patellar   button.      SURGEON:  Pietro Cassis. [name], M.D.      ASSISTANT:  Danae Orleans, PA-C.      ANESTHESIA:  Spinal.      SPECIMENS:  None.      COMPLICATION:  None.      DRAINS:  None.  EBL: <50cc      TOURNIQUET TIME:   Total Tourniquet Time Documented: Thigh (Right) - 29 minutes Total: Thigh (Right) - 29 minutes  .      The patient was stable to the recovery room.      INDICATION FOR PROCEDURE:  Blake Diaz is a 59 y.o. male patient of   mine.  The patient had been seen, evaluated, and treated conservatively in the   office with medication, activity modification, and injections.  The patient had   radiographic changes of bone-on-bone arthritis with endplate sclerosis and osteophytes noted.      The patient failed conservative measures including medication, injections, and activity modification, and at this point was ready for more definitive measures.   Based on the radiographic changes and failed conservative measures, the patient   decided to proceed with total knee replacement.  Risks of infection,   DVT, component failure, need  for revision surgery, postop course, and   expectations were all   discussed and reviewed.  Consent was obtained for benefit of pain   relief.      PROCEDURE IN DETAIL:  The patient was brought to the operative theater.   Once adequate anesthesia, preoperative antibiotics, 2 gm of Ancef, 1gm of Tranexamic Acid, and 10mg  of Decadron administered, the patient was positioned supine with the right thigh tourniquet placed.  The  right lower extremity was prepped and draped in sterile fashion.  A time-   out was performed identifying the patient, planned procedure, and   extremity.      The right lower extremity was placed in the [facility] leg holder.  The leg was   exsanguinated, tourniquet elevated to 250 mmHg.  A midline incision was   made followed by median  parapatellar arthrotomy.  Following initial   exposure, attention was first directed to the patella.  Precut   measurement was noted to be 25 mm.  I resected down to 14 mm and used a   41 patellar button to restore patellar height as well as cover the cut   surface.      The lug holes were drilled and a metal shim was placed to protect the   patella from retractors and saw blades.      At this point, attention was now directed to the femur.  The femoral   canal was opened with a drill, irrigated to try to prevent fat emboli.  An   intramedullary rod was passed at 5 degrees valgus, 9 mm of bone was   resected off the distal femur.  Following this resection, the tibia was   subluxated anteriorly.  Using the extramedullary guide, 2 mm of bone was resected off   the proximal medial tibia.  We confirmed the gap would be   stable medially and laterally with a 6 mm insert as well as confirmed   the cut was perpendicular in the coronal plane, checking with an alignment rod.      Once this was done, I sized the femur to be a size 6 in the anterior-   posterior dimension, chose a standard component based on medial and   lateral dimension.  The  size 6 rotation block was then pinned in   position anterior referenced using the C-clamp to set rotation.  The   anterior, posterior, and  chamfer cuts were made without difficulty nor   notching making certain that I was along the anterior cortex to help   with flexion gap stability.      The final box cut was made off the lateral aspect of distal femur.      At this point, the tibia was sized to be a size 7, the size 7 tray was   then pinned in position through the medial third of the tubercle,   drilled, and keel punched.  Trial reduction was now carried with a 6 femur,  7 tibia, a 8 mm insert, and the 41 patella botton.  The knee was brought to   extension, full extension with good flexion stability with the patella   tracking through the trochlea without application of pressure.  Given   all these findings, the trial components removed.  Final components were   opened and cement was mixed.  The knee was irrigated with normal saline   solution and pulse lavage.  The synovial lining was   then injected with 30cc of 0.25% Marcaine with epinephrine and 1 cc of Toradol plus 30cc of NS for a   total of 61 cc.      The knee was irrigated.  Final implants were then cemented onto clean and   dried cut surfaces of bone with the knee brought to extension with a size 8 mm trial insert.      Once the cement had fully cured, the excess cement was removed   throughout the knee.  I confirmed I was satisfied with the range of   motion and stability, and the final size 8 mm PS AOX insert was chosen.  It was   placed into the knee.      The tourniquet had been let down at 29 minutes.  No significant   hemostasis required.  The   extensor mechanism was then reapproximated  using #1 Vicryl and #0 V-lock sutures with the knee   in flexion.  The   remaining wound was closed with 2-0 Vicryl and running 4-0 Monocryl.   The knee was cleaned, dried, dressed sterilely using Dermabond and   Aquacel  dressing.  The patient was then   brought to recovery room in stable condition, tolerating the procedure   well.   Please note that Physician Assistant, Danae Orleans, PA-C, was present for the entirety of the case, and was utilized for pre-operative positioning, peri-operative retractor management, general facilitation of the procedure.  He was also utilized for primary wound closure at the end of the case.              Pietro Cassis [name], M.D.    11/08/2014 3:11 PM

## 2014-11-08 NOTE — Interval H&P Note (Signed)
History and Physical Interval Note:  11/08/2014 1:06 PM  Blake Diaz  has presented today for surgery, with the diagnosis of RIGHT KNEE OA   The various methods of treatment have been discussed with the patient and family. After consideration of risks, benefits and other options for treatment, the patient has consented to  Procedure(s): RIGHT TOTAL KNEE ARTHROPLASTY (Right) as a surgical intervention .  The patient's history has been reviewed, patient examined, no change in status, stable for surgery.  I have reviewed the patient's chart and labs.  Questions were answered to the patient's satisfaction.     Mauri Pole

## 2014-11-08 NOTE — Anesthesia Preprocedure Evaluation (Addendum)
Anesthesia Evaluation  Patient identified by MRN, date of birth, ID band Patient awake    Reviewed: Allergy & Precautions, H&P , NPO status , Patient's Chart, lab work & pertinent test results  Airway Mallampati: III  TM Distance: >3 FB Neck ROM: full    Dental no notable dental hx. (+) Teeth Intact, Dental Advisory Given   Pulmonary sleep apnea , former smoker,  breath sounds clear to auscultation  Pulmonary exam normal       Cardiovascular Exercise Tolerance: Good hypertension, Pt. on medications Rhythm:regular Rate:Normal     Neuro/Psych negative neurological ROS  negative psych ROS   GI/Hepatic negative GI ROS, Neg liver ROS, GERD-  Controlled,  Endo/Other  negative endocrine ROS  Renal/GU negative Renal ROS  negative genitourinary   Musculoskeletal   Abdominal   Peds  Hematology negative hematology ROS (+)   Anesthesia Other Findings   Reproductive/Obstetrics negative OB ROS                            Anesthesia Physical Anesthesia Plan  ASA: III  Anesthesia Plan: Spinal   Post-op Pain Management:    Induction:   Airway Management Planned:   Additional Equipment:   Intra-op Plan:   Post-operative Plan:   Informed Consent: I have reviewed the patients History and Physical, chart, labs and discussed the procedure including the risks, benefits and alternatives for the proposed anesthesia with the patient or authorized representative who has indicated his/her understanding and acceptance.   Dental Advisory Given  Plan Discussed with: CRNA and Surgeon  Anesthesia Plan Comments:         Anesthesia Quick Evaluation

## 2014-11-08 NOTE — Transfer of Care (Signed)
Immediate Anesthesia Transfer of Care Note  Patient: Blake Diaz  Procedure(s) Performed: Procedure(s) (LRB): RIGHT TOTAL KNEE ARTHROPLASTY (Right)  Patient Location: PACU  Anesthesia Type: Spinal  Level of Consciousness: sedated, patient cooperative and responds to stimulation  Airway & Oxygen Therapy: Patient Spontanous Breathing and Patient connected to face mask oxgen  Post-op Assessment: Report given to PACU RN and Post -op Vital signs reviewed and stable  Post vital signs: Reviewed and stable  Complications: No apparent anesthesia complications

## 2014-11-08 NOTE — Anesthesia Postprocedure Evaluation (Signed)
  Anesthesia Post-op Note  Patient: Blake Diaz  Procedure(s) Performed: Procedure(s) (LRB): RIGHT TOTAL KNEE ARTHROPLASTY (Right)  Patient Location: PACU  Anesthesia Type: Spinal  Level of Consciousness: awake and alert   Airway and Oxygen Therapy: Patient Spontanous Breathing  Post-op Pain: mild  Post-op Assessment: Post-op Vital signs reviewed, Patient's Cardiovascular Status Stable, Respiratory Function Stable, Patent Airway and No signs of Nausea or vomiting  Last Vitals:  Filed Vitals:   11/08/14 1545  BP: 106/64  Pulse: 66  Temp:   Resp: 13    Post-op Vital Signs: stable   Complications: No apparent anesthesia complications

## 2014-11-08 NOTE — Plan of Care (Signed)
Problem: Consults Goal: Diagnosis- Total Joint Replacement Primary Total Knee, Right

## 2014-11-08 NOTE — Progress Notes (Signed)
RT set CPAP on auto titrate 5-20cmH2O on room air via nasal mask. Sterile water was added for humidification. Pt states he does not need any assistance with mask application and will place himself on when ready. Pt was encouraged to contact RT for assistance throughout the night if needed. RT will continue to monitor as needed.

## 2014-11-09 LAB — BASIC METABOLIC PANEL
Anion gap: 6 (ref 5–15)
BUN: 8 mg/dL (ref 6–23)
CO2: 23 mmol/L (ref 19–32)
Calcium: 8.6 mg/dL (ref 8.4–10.5)
Chloride: 106 mmol/L (ref 96–112)
Creatinine, Ser: 0.72 mg/dL (ref 0.50–1.35)
GFR calc Af Amer: >90 mL/min (ref >90)
GFR calc non Af Amer: >90 mL/min (ref >90)
Glucose, Bld: 123 mg/dL — ABNORMAL HIGH (ref 70–99)
Potassium: 3.9 mmol/L (ref 3.5–5.1)
Sodium: 135 mmol/L (ref 135–145)

## 2014-11-09 LAB — CBC
HCT: 35.3 % — ABNORMAL LOW (ref 39.0–52.0)
Hemoglobin: 12.2 g/dL — ABNORMAL LOW (ref 13.0–17.0)
MCH: 32.5 pg (ref 26.0–34.0)
MCHC: 34.6 g/dL (ref 30.0–36.0)
MCV: 94.1 fL (ref 78.0–100.0)
Platelets: 186 10*3/uL (ref 150–400)
RBC: 3.75 MIL/uL — ABNORMAL LOW (ref 4.22–5.81)
RDW: 12.4 % (ref 11.5–15.5)
WBC: 9.1 10*3/uL (ref 4.0–10.5)

## 2014-11-09 MED ORDER — DOCUSATE SODIUM 100 MG PO CAPS: 100.0000 mg | ORAL_CAPSULE | Freq: Two times a day (BID) | ORAL | Status: DC

## 2014-11-09 MED ORDER — POLYETHYLENE GLYCOL 3350 17 G PO PACK: 17.0000 g | PACK | Freq: Two times a day (BID) | ORAL | Status: DC

## 2014-11-09 MED ORDER — METHOCARBAMOL 500 MG PO TABS
500.0000 mg | ORAL_TABLET | Freq: Four times a day (QID) | ORAL | Status: DC | PRN
Start: 2014-11-09 — End: 2019-12-07

## 2014-11-09 MED ORDER — ASPIRIN 325 MG PO TBEC: 325.0000 mg | DELAYED_RELEASE_TABLET | Freq: Two times a day (BID) | ORAL | Status: AC

## 2014-11-09 MED ORDER — HYDROCODONE-ACETAMINOPHEN 7.5-325 MG PO TABS: 1.0000 | ORAL_TABLET | ORAL | Status: DC | PRN

## 2014-11-09 MED ORDER — FERROUS SULFATE 325 (65 FE) MG PO TABS: 325.0000 mg | ORAL_TABLET | Freq: Three times a day (TID) | ORAL | Status: DC

## 2014-11-09 NOTE — Care Management Note (Signed)
    Page 1 of 1   11/09/2014     12:15:05 PM CARE MANAGEMENT NOTE 11/09/2014  Patient:  Blake Diaz, Blake Diaz   Account Number:  [id number]  Date Initiated:  11/09/2014  Documentation initiated by:  [facility]  Subjective/Objective Assessment:   adm: RIGHT TOTAL KNEE ARTHROPLASTY (Right)     Action/Plan:   discharge planning   Anticipated DC Date:  11/09/2014   Anticipated DC Plan:  [place]  CM consult      [facility] Choice  HOME HEALTH   Choice offered to / List presented to:  C-1 Patient        [place] arranged  HH-2 PT      [place]   Status of service:  Completed, signed off Medicare Important Message given?   (If response is "NO", the following Medicare IM given date fields will be blank) Date Medicare IM given:   Medicare IM given by:   Date Additional Medicare IM given:   Additional Medicare IM given by:    Discharge Disposition:  [place]  Per UR Regulation:    If discussed at Long Length of Stay Meetings, dates discussed:    Comments:  11/09/14 08:15 Cm met with pt in room to offer choice of home health agency.  Pt chooses [facility] to render HHPT.  Address and contact information verified with pt.  Referral given to [facility] rep, Tim (on unit).  No DME needed.  No other CM needs were communicated.  Mariane Masters, BSN, CM [phone].

## 2014-11-09 NOTE — Evaluation (Signed)
Physical Therapy Evaluation Patient Details Name: Blake Diaz MRN: [id number] DOB: 05/21/1956 Today's Date: 11/09/2014   History of Present Illness  59 yo male s/p R TKA 11/08/14.   Clinical Impression  On eval, pt required Min assist for mobility-able to ambulate ~150 feet with RW, climb 2 steps with 1 HHA. Wife present during session and assisted with stair negotiation. Marland Kitchen Pt/wife declined OT consult-so reviewed a few key points with respect to ADLs as well-wife reports she can assist as needed. All education completed. Ready to d/c from PT standpoint.     Follow Up Recommendations Home health PT    Equipment Recommendations  Rolling walker with 5" wheels (pt/wife report they will borrow walker from friend)    Recommendations for Other Services  (pt/wife declined OT consult)     Precautions / Restrictions Precautions Precautions: Fall;Knee Restrictions Weight Bearing Restrictions: No RLE Weight Bearing: Weight bearing as tolerated      Mobility  Bed Mobility Overal bed mobility: Needs Assistance Bed Mobility: Supine to Sit     Supine to sit: Min assist     General bed mobility comments: Assist for R LE off bed.   Transfers Overall transfer level: Needs assistance Equipment used: Rolling walker (2 wheeled) Transfers: Sit to/from Stand Sit to Stand: Min guard         General transfer comment: close guard for safety. VCs safety, technique, hand placement  Ambulation/Gait Ambulation/Gait assistance: Min guard Ambulation Distance (Feet): 150 Feet (150'x1, 75'x1) Assistive device: Rolling walker (2 wheeled) Gait Pattern/deviations: Step-through pattern;Antalgic;Decreased stride length     General Gait Details: Pt quickly transitioned to step through gait pattern from step to. Tolerated distance well.   Stairs Stairs: Yes Stairs assistance: Min assist Stair Management: Step to pattern;Forwards Number of Stairs: 2 General stair comments: wife provided 1 HHA.  VCs safety, technique, sequence.   Wheelchair Mobility    Modified Rankin (Stroke Patients Only)       Balance                                             Pertinent Vitals/Pain Pain Assessment: 0-10 Pain Score: 3  Pain Location: R knee Pain Descriptors / Indicators: Aching;Sore Pain Intervention(s): Monitored during session;Ice applied    Home Living Family/patient expects to be discharged to:: Private residence Living Arrangements: Spouse/significant other Available Help at Discharge: Family Type of Home: House Home Access: Stairs to enter Entrance Stairs-Rails: None Technical brewer of Steps: 3 Home Layout: One level Home Equipment: None Additional Comments: pt states he will borrow walker from a friend    Prior Function Level of Independence: Independent               Hand Dominance        Extremity/Trunk Assessment   Upper Extremity Assessment: Overall WFL for tasks assessed           Lower Extremity Assessment: RLE deficits/detail RLE Deficits / Details: hip flex 3-/5, knee ext 3/5, moves ankle well, moves ankle well    Cervical / Trunk Assessment: Normal  Communication   Communication: No difficulties  Cognition Arousal/Alertness: Awake/alert Behavior During Therapy: WFL for tasks assessed/performed Overall Cognitive Status: Within Functional Limits for tasks assessed                      General Comments  Exercises Total Joint Exercises Ankle Circles/Pumps: AROM;Both;10 reps;Seated Quad Sets: AROM;Both;10 reps;Seated Hip ABduction/ADduction: AAROM;Right;10 reps;Seated Straight Leg Raises: AAROM;Right;10 reps;Seated Knee Flexion: AAROM;Right;10 reps;Seated Goniometric ROM: ~10-60 degrees      Assessment/Plan    PT Assessment Patient needs continued PT services  PT Diagnosis Difficulty walking;Acute pain   PT Problem List Decreased strength;Decreased range of motion;Decreased  mobility;Pain;Decreased knowledge of use of DME  PT Treatment Interventions DME instruction;Gait training;Stair training;Functional mobility training;Therapeutic activities;Therapeutic exercise;Patient/family education   PT Goals (Current goals can be found in the Care Plan section) Acute Rehab PT Goals Patient Stated Goal: regain independence PT Goal Formulation: With patient/family Time For Goal Achievement: 11/16/14 Potential to Achieve Goals: Good    Frequency 7X/week   Barriers to discharge        Co-evaluation               End of Session Equipment Utilized During Treatment: Gait belt Activity Tolerance: Patient tolerated treatment well Patient left: in chair;with call bell/phone within reach;with family/visitor present           Time: 7943-2761 PT Time Calculation (min) (ACUTE ONLY): 42 min   Charges:   PT Evaluation $Initial PT Evaluation Tier I: 1 Procedure PT Treatments $Gait Training: 8-22 mins $Therapeutic Exercise: 8-22 mins   PT G Codes:        Weston Anna, MPT Pager: [phone]

## 2014-11-09 NOTE — Discharge Instructions (Signed)

## 2014-11-09 NOTE — Progress Notes (Signed)
Pt to d/c home. No equipment needs. AVS reviewed and "My Chart" discussed with pt. Pt capable of verbalizing medications, signs and symptoms of infection, and follow-up appointments. Remains hemodynamically stable. No signs and symptoms of distress. Educated pt to return to ER in the case of SOB, dizziness, or chest pain.

## 2014-11-09 NOTE — Progress Notes (Signed)
Utilization review completed.  

## 2014-11-09 NOTE — Progress Notes (Signed)
Patient ID: Blake Diaz, male   DOB: [birth year], 59 y.o.   MRN: [id number] Subjective: 1 Day Post-Op Procedure(s) (LRB): RIGHT TOTAL KNEE ARTHROPLASTY (Right)    Patient reports pain as mild particularly at rest reporting no pain.  Ready to progress  Objective:   VITALS:   Filed Vitals:   11/09/14 0800  BP:   Pulse:   Temp:   Resp: 16    Neurovascular intact Incision: dressing C/D/I  LABS  Recent Labs  11/09/14 0425  HGB 12.2*  HCT 35.3*  WBC 9.1  PLT 186     Recent Labs  11/09/14 0425  NA 135  K 3.9  BUN 8  CREATININE 0.72  GLUCOSE 123*    No results for input(s): LABPT, INR in the last 72 hours.   Assessment/Plan: 1 Day Post-Op Procedure(s) (LRB): RIGHT TOTAL KNEE ARTHROPLASTY (Right)   Advance diet Up with therapy Discharge home with home health after therapy this am  RTC in 2 weeks Reviewed goals

## 2014-11-09 NOTE — Progress Notes (Signed)
OT Cancellation Note  Patient Details Name: Blake Diaz MRN: [id number] DOB: [birth year]   Cancelled Treatment:    Reason Eval/Treat Not Completed: OT screened, no needs identified, will sign off  Darlina Rumpf [place], OTR/L [phone]  11/09/2014, 9:55 AM

## 2014-11-10 ENCOUNTER — Encounter (HOSPITAL_COMMUNITY): Payer: Self-pay | Admitting: Orthopedic Surgery

## 2014-11-21 NOTE — Discharge Summary (Signed)
Physician Discharge Summary  Patient ID: Blake Diaz MRN: [id number] DOB/AGE: 1955-12-16 59 y.o.  Admit date: 11/08/2014 Discharge date: 11/09/2014   Procedures:  Procedure(s) (LRB): RIGHT TOTAL KNEE ARTHROPLASTY (Right)  Attending Physician:  Dr. Paralee Cancel   Admission Diagnoses:   Right knee primary OA / pain  Discharge Diagnoses:  Principal Problem:   S/P right TKA Active Problems:   S/P knee replacement  Past Medical History  Diagnosis Date  . Hypertension     presently taking Lisinopril sparingly  . Sleep apnea     nasal prongs, cpap used occ.  Marland Kitchen GERD (gastroesophageal reflux disease)     acid reflux uses OTS "TUMS"  . Arthritis     osteoarthritis right knee  . Elevated liver enzymes     history of "never any continued follow up"-"not a problem"    HPI:    Blake Diaz, 59 y.o. male, has a history of pain and functional disability in the right knee due to arthritis and has failed non-surgical conservative treatments for greater than 12 weeks to include NSAID's and/or analgesics, corticosteriod injections, viscosupplementation injections and activity modification. Onset of symptoms was gradual, starting years ago with gradually worsening course since that time. The patient noted no past surgery on the right knee(s). Patient currently rates pain in the right knee(s) at 8 out of 10 with activity. Patient has night pain, worsening of pain with activity and weight bearing, pain that interferes with activities of daily living, pain with passive range of motion, crepitus and joint swelling. Patient has evidence of periarticular osteophytes and joint space narrowing by imaging studies. There is no active infection. Risks, benefits and expectations were discussed with the patient. Risks including but not limited to the risk of anesthesia, blood clots, nerve damage, blood vessel damage, failure of the prosthesis, infection and up to and including death. Patient  understand the risks, benefits and expectations and wishes to proceed with surgery.   PCP: No PCP Per Patient   Discharged Condition: good  Hospital Course:  Patient underwent the above stated procedure on 11/08/2014. Patient tolerated the procedure well and brought to the recovery room in good condition and subsequently to the floor.  POD #1 BP: 116/71 ; Pulse: 63 ; Temp: 98.5 F (36.9 C) ; Resp: 16 Patient reports pain as mild particularly at rest reporting no pain. Ready to progress. Dorsiflexion/plantar flexion intact, incision: dressing C/D/I, no cellulitis present and compartment soft.   LABS  Basename    HGB  12.2  HCT  35.3    Discharge Exam: General appearance: alert, cooperative and no distress Extremities: Homans sign is negative, no sign of DVT, no edema, redness or tenderness in the calves or thighs and no ulcers, gangrene or trophic changes  Disposition: Home with follow up in 2 weeks   Follow-up Information    Follow up with Mauri Pole, MD. Schedule an appointment as soon as possible for a visit in 2 weeks.   Specialty:  Orthopedic Surgery   Contact information:   [address] [place] [address] [phone]       Follow up with [facility].   Why:  home health physical therapy   Contact information:   [place] [address] [phone]       Discharge Instructions    Call MD / Call 911    Complete by:  As directed   If you experience chest pain or shortness of breath,  CALL 911 and be transported to the hospital emergency room.  If you develope a fever above 101 F, pus (white drainage) or increased drainage or redness at the wound, or calf pain, call your surgeon's office.     Change dressing    Complete by:  As directed   Maintain surgical dressing until follow up in the clinic. If the edges start to pull up, may reinforce with tape. If the dressing is no longer working, may remove and cover  with gauze and tape, but must keep the area dry and clean.  Call with any questions or concerns.     Constipation Prevention    Complete by:  As directed   Drink plenty of fluids.  Prune juice may be helpful.  You may use a stool softener, such as Colace (over the counter) 100 mg twice a day.  Use MiraLax (over the counter) for constipation as needed.     Diet - low sodium heart healthy    Complete by:  As directed      Discharge instructions    Complete by:  As directed   Maintain surgical dressing until follow up in the clinic. If the edges start to pull up, may reinforce with tape. If the dressing is no longer working, may remove and cover with gauze and tape, but must keep the area dry and clean.  Follow up in 2 weeks at [facility]. Call with any questions or concerns.     Increase activity slowly as tolerated    Complete by:  As directed      TED hose    Complete by:  As directed   Use stockings (TED hose) for 2 weeks on both leg(s).  You may remove them at night for sleeping.     Weight bearing as tolerated    Complete by:  As directed   Laterality:  right  Extremity:  Lower             Medication List    STOP taking these medications        acetaminophen 500 MG tablet  Commonly known as:  TYLENOL     ibuprofen 200 MG tablet  Commonly known as:  ADVIL,MOTRIN     meloxicam 7.5 MG tablet  Commonly known as:  MOBIC      TAKE these medications        aspirin 325 MG EC tablet  Take 1 tablet (325 mg total) by mouth 2 (two) times daily.     desonide 0.05 % cream  Commonly known as:  DESOWEN  Apply 1 application topically 2 (two) times daily as needed (Itching).     docusate sodium 100 MG capsule  Commonly known as:  COLACE  Take 1 capsule (100 mg total) by mouth 2 (two) times daily.     ferrous sulfate 325 (65 FE) MG tablet  Take 1 tablet (325 mg total) by mouth 3 (three) times daily after meals.     fish oil-omega-3 fatty acids 1000 MG capsule  Take  1 g by mouth daily.     HYDROcodone-acetaminophen 7.5-325 MG per tablet  Commonly known as:  NORCO  Take 1-2 tablets by mouth every 4 (four) hours as needed for moderate pain.     lisinopril 10 MG tablet  Commonly known as:  PRINIVIL,ZESTRIL  Take 10 mg by mouth daily.     methocarbamol 500 MG tablet  Commonly known as:  ROBAXIN  Take 1 tablet (500 mg total) by  mouth every 6 (six) hours as needed for muscle spasms.     multivitamin with minerals Tabs tablet  Take 1 tablet by mouth daily.     omeprazole 20 MG capsule  Commonly known as:  PRILOSEC  Take 20 mg by mouth daily.     polyethylene glycol packet  Commonly known as:  MIRALAX / GLYCOLAX  Take 17 g by mouth 2 (two) times daily.     VITAMIN B-12 PO  Take 1 tablet by mouth daily.     vitamin E 400 UNIT capsule  Generic drug:  vitamin E  Take 400 Units by mouth daily.         Signed: [place] Pugh. Rhianna Raulerson   PA-C  11/21/2014, 9:43 AM

## 2015-02-24 ENCOUNTER — Other Ambulatory Visit: Payer: Self-pay | Admitting: Internal Medicine

## 2015-02-24 ENCOUNTER — Ambulatory Visit
Admission: RE | Admit: 2015-02-24 | Discharge: 2015-02-24 | Disposition: A | Discharge status: Home / Self Care | Payer: Federal, State, Local not specified - PPO | Source: Ambulatory Visit | Attending: Internal Medicine | Admitting: Internal Medicine

## 2015-02-24 DIAGNOSIS — R0602 Shortness of breath: Secondary | ICD-10-CM

## 2015-05-08 DIAGNOSIS — G4733 Obstructive sleep apnea (adult) (pediatric): Secondary | ICD-10-CM | POA: Insufficient documentation

## 2015-05-08 DIAGNOSIS — I10 Essential (primary) hypertension: Secondary | ICD-10-CM | POA: Insufficient documentation

## 2015-05-09 ENCOUNTER — Ambulatory Visit (INDEPENDENT_AMBULATORY_CARE_PROVIDER_SITE_OTHER): Payer: Federal, State, Local not specified - PPO | Admitting: Interventional Cardiology

## 2015-05-09 ENCOUNTER — Other Ambulatory Visit: Payer: Self-pay | Admitting: Interventional Cardiology

## 2015-05-09 ENCOUNTER — Encounter: Payer: Self-pay | Admitting: Interventional Cardiology

## 2015-05-09 VITALS — BP 130/80 | HR 67 | Ht 71.0 in | Wt 198.4 lb

## 2015-05-09 DIAGNOSIS — E785 Hyperlipidemia, unspecified: Secondary | ICD-10-CM | POA: Diagnosis not present

## 2015-05-09 DIAGNOSIS — R079 Chest pain, unspecified: Secondary | ICD-10-CM | POA: Insufficient documentation

## 2015-05-09 DIAGNOSIS — Z01812 Encounter for preprocedural laboratory examination: Secondary | ICD-10-CM

## 2015-05-09 DIAGNOSIS — I10 Essential (primary) hypertension: Secondary | ICD-10-CM

## 2015-05-09 DIAGNOSIS — G4733 Obstructive sleep apnea (adult) (pediatric): Secondary | ICD-10-CM | POA: Diagnosis not present

## 2015-05-09 LAB — CBC WITH DIFFERENTIAL/PLATELET
Basophils Absolute: 0 10*3/uL (ref 0.0–0.1)
Basophils Relative: 0.8 % (ref 0.0–3.0)
Eosinophils Absolute: 0.5 10*3/uL (ref 0.0–0.7)
Eosinophils Relative: 9.5 % — ABNORMAL HIGH (ref 0.0–5.0)
HCT: 42.3 % (ref 39.0–52.0)
Hemoglobin: 14.4 g/dL (ref 13.0–17.0)
Lymphocytes Relative: 32.9 % (ref 12.0–46.0)
Lymphs Abs: 1.9 10*3/uL (ref 0.7–4.0)
MCHC: 34 g/dL (ref 30.0–36.0)
MCV: 94.9 fl (ref 78.0–100.0)
Monocytes Absolute: 0.4 10*3/uL (ref 0.1–1.0)
Monocytes Relative: 7.3 % (ref 3.0–12.0)
Neutro Abs: 2.8 10*3/uL (ref 1.4–7.7)
Neutrophils Relative %: 49.5 % (ref 43.0–77.0)
Platelets: 244 10*3/uL (ref 150.0–400.0)
RBC: 4.45 Mil/uL (ref 4.22–5.81)
RDW: 13.1 % (ref 11.5–15.5)
WBC: 5.7 10*3/uL (ref 4.0–10.5)

## 2015-05-09 LAB — BASIC METABOLIC PANEL
BUN: 10 mg/dL (ref 6–23)
CO2: 24 mEq/L (ref 19–32)
Calcium: 9.5 mg/dL (ref 8.4–10.5)
Chloride: 104 mEq/L (ref 96–112)
Creatinine, Ser: 0.8 mg/dL (ref 0.40–1.50)
GFR: 104.32 mL/min (ref >60.00)
Glucose, Bld: 119 mg/dL — ABNORMAL HIGH (ref 70–99)
Potassium: 3.9 mEq/L (ref 3.5–5.1)
Sodium: 138 mEq/L (ref 135–145)

## 2015-05-09 LAB — PROTIME-INR
INR: 1 ratio (ref 0.8–1.0)
Prothrombin Time: 11.1 s (ref 9.6–13.1)

## 2015-05-09 MED ORDER — NITROGLYCERIN 0.4 MG SL SUBL: 0.4000 mg | SUBLINGUAL_TABLET | SUBLINGUAL | Status: DC | PRN

## 2015-05-09 MED ORDER — ASPIRIN EC 81 MG PO TBEC: 81.0000 mg | DELAYED_RELEASE_TABLET | Freq: Every day | ORAL | Status: DC

## 2015-05-09 NOTE — Progress Notes (Signed)
Cardiology Office Note   Date:  05/09/2015   ID:  Blake Diaz, DOB Jun 17, 1956, MRN [id number]  PCP:  No PCP Per Patient  Cardiologist:  Blake Grooms, MD   Chief Complaint  Patient presents with  . Chest Pain      History of Present Illness: Blake Diaz is a 59 y.o. male who presents for hyperlipidemia, hypertension, prior smoker, and family history of CAD.  59 year old gentleman with prior heavy cigarette smoking history but discontinued 30 years ago. Over the last 2-4 months he has experienced reproducible left lateral chest aching and left arm numbness that is relieved with rest. He is more likely to experience the discomfort when it involves upper extremity repetitive motion as well as walking or other physical activity. The discomfort caused him to take 3 times longer power washing his house when recently attempted. Rest relieves the discomfort. He has had one episode at rest. That episode occurred after heavy physical activity where he was mowing using a push mower. He has had the discomfort at least 10 times over the past several months.    Past Medical History  Diagnosis Date  . Hypertension     presently taking Lisinopril sparingly  . Sleep apnea     nasal prongs, cpap used occ.  Marland Kitchen GERD (gastroesophageal reflux disease)     acid reflux uses OTS "TUMS"  . Arthritis     osteoarthritis right knee  . Elevated liver enzymes     history of "never any continued follow up"-"not a problem"    Past Surgical History  Procedure Laterality Date  . Shoulder surgery Right     open -bone spur and ligament tears"some limited ROM"  . Hand surgery Left     trigger finger  . Hernia repair      Umbilical, bilateral inguinal hernis repair  . Total knee arthroplasty Right 11/08/2014    Procedure: RIGHT TOTAL KNEE ARTHROPLASTY;  Surgeon: Paralee Cancel, MD;  Location: [facility];  Service: Orthopedics;  Laterality: Right;     Current Outpatient Prescriptions  Medication Sig  Dispense Refill  . Cyanocobalamin (VITAMIN B-12 PO) Take 1 tablet by mouth daily.    Marland Kitchen desonide (DESOWEN) 0.05 % cream Apply 1 application topically 2 (two) times daily as needed (Itching).     Marland Kitchen docusate sodium (COLACE) 100 MG capsule Take 1 capsule (100 mg total) by mouth 2 (two) times daily. 10 capsule 0  . ferrous sulfate 325 (65 FE) MG tablet Take 1 tablet (325 mg total) by mouth 3 (three) times daily after meals.  3  . fish oil-omega-3 fatty acids 1000 MG capsule Take 1 g by mouth daily.      Marland Kitchen HYDROcodone-acetaminophen (NORCO) 7.5-325 MG per tablet Take 1-2 tablets by mouth every 4 (four) hours as needed for moderate pain. 100 tablet 0  . lisinopril (PRINIVIL,ZESTRIL) 30 MG tablet Take 30 mg by mouth daily.    . methocarbamol (ROBAXIN) 500 MG tablet Take 1 tablet (500 mg total) by mouth every 6 (six) hours as needed for muscle spasms. 50 tablet 0  . Multiple Vitamin (MULITIVITAMIN WITH MINERALS) TABS Take 1 tablet by mouth daily.      Marland Kitchen omeprazole (PRILOSEC) 20 MG capsule Take 20 mg by mouth daily.    . polyethylene glycol (MIRALAX / GLYCOLAX) packet Take 17 g by mouth 2 (two) times daily. 14 each 0  . vitamin E (VITAMIN E) 400 UNIT capsule Take 400 Units by mouth daily.  No current facility-administered medications for this visit.    Allergies:   Augmentin    Social History:  The patient  reports that he quit smoking about 20 years ago. His smoking use included Cigarettes. He does not have any smokeless tobacco history on file. He reports that he does not drink alcohol or use illicit drugs.   Family History:  The patient's family history includes Healthy in his daughter and son; Heart disease in his brother; Hypertension in his mother; Other in his mother.    ROS:  Please see the history of present illness.   Otherwise, review of systems are positive for nuclear perfusion study from greater than 5 years ago was some question of abnormality (the report is not available)  occasional leg swelling, dyspnea on exertion, difficulty with balance..   All other systems are reviewed and negative.    PHYSICAL EXAM: VS:  BP 130/80 mmHg  Pulse 67  Ht 5\' 11"  (1.803 m)  Wt 89.994 kg (198 lb 6.4 oz)  BMI 27.68 kg/m2 , BMI Body mass index is 27.68 kg/(m^2). GEN: Well nourished, well developed, in no acute distress HEENT: normal Neck: no JVD, carotid bruits, or masses Cardiac: RRR.  There is no murmur, rub, or gallop. There is no edema. Respiratory:  clear to auscultation bilaterally, normal work of breathing. GI: soft, nontender, nondistended, + BS MS: no deformity or atrophy Skin: warm and dry, no rash Neuro:  Strength and sensation are intact Psych: euthymic mood, full affect   EKG:  EKG is  ordered today. The ekg reveals normal   Recent Labs: 10/27/2014: ALT 54* 11/09/2014: BUN 8; Creatinine, Ser 0.72; Hemoglobin 12.2*; Platelets 186; Potassium 3.9; Sodium 135    Lipid Panel No results found for: CHOL, TRIG, HDL, CHOLHDL, VLDL, LDLCALC, LDLDIRECT    Wt Readings from Last 3 Encounters:  05/09/15 89.994 kg (198 lb 6.4 oz)  11/08/14 90.719 kg (200 lb)  10/27/14 90.719 kg (200 lb)      Other studies Reviewed: Additional studies/ records that were reviewed today include: Records from [facility] The findings include awaiting prior nuclear study which according to the patient was not completely normal 5 years ago.    ASSESSMENT AND PLAN:  1. Chest pain consistent with angina pectoris The story of reproducible left precordial and axillary discomfort with radiation and numbness into the left arm occurring during physical activity that requires repetitive arm motion is compatible with angina pectoris.  2. OSA (obstructive sleep apnea) Wears C Pap  3. Essential hypertension Good control  4. Hyperlipidemia    Current medicines are reviewed at length with the patient today.  The patient has the following concerns regarding medicines: Use of  nitroglycerin.  The following changes/actions have been instituted:    Nitroglycerin 0.4 mg to be used for any prolonged discomfort lasting greater than 5 minutes  Avoid activities that precipitate chest discomfort The patient was advised about the risks and benefits of coronary angiography. The risks including death, stroke, kidney failure, heart attack, limb ischemia, bleeding and allergy were discussed and accepted by the patient.  Questions were answered and the patient accepts the procedure and willing to proceed.  Labs/ tests ordered today include:  No orders of the defined types were placed in this encounter.     Disposition:   FU with HS in when necessary   Signed, Blake Grooms, MD  05/09/2015 9:35 AM    [address] [place], [place], [place]  [id number]  Phone: [phone]; Fax: [phone]

## 2015-05-09 NOTE — Patient Instructions (Addendum)
Medication Instructions: Your physician has recommended you make the following change in your medication:  START Nitro-glycerin use as directed   Labwork: Bmet, Cbc, Pt/Inr today  Testing/Procedures: Your physician has requested that you have a cardiac catheterization. Cardiac catheterization is used to diagnose and/or treat various heart conditions. Doctors may recommend this procedure for a number of different reasons. The most common reason is to evaluate chest pain. Chest pain can be a symptom of coronary artery disease (CAD), and cardiac catheterization can show whether plaque is narrowing or blocking your heart's arteries. This procedure is also used to evaluate the valves, as well as measure the blood flow and oxygen levels in different parts of your heart. For further information please visit [email or web address]. Please follow instruction sheet, as given.   Follow-Up: Your physician recommends that you schedule a follow-up appointment pending procedure   Nitroglycerin sublingual tablets What is this medicine? NITROGLYCERIN (nye troe GLI ser in) is a type of vasodilator. It relaxes blood vessels, increasing the blood and oxygen supply to your heart. This medicine is used to relieve chest pain caused by angina. It is also used to prevent chest pain before activities like climbing stairs, going outdoors in cold weather, or sexual activity. This medicine may be used for other purposes; ask your health care provider or pharmacist if you have questions. COMMON BRAND NAME(S): Nitroquick, Nitrostat, Nitrotab What should I tell my health care provider before I take this medicine? They need to know if you have any of these conditions: -anemia -head injury, recent stroke, or bleeding in the brain -liver disease -previous heart attack -an unusual or allergic reaction to nitroglycerin, other medicines, foods, dyes, or preservatives -pregnant or trying to get pregnant -breast-feeding How  should I use this medicine? Take this medicine by mouth as needed. At the first sign of an angina attack (chest pain or tightness) place one tablet under your tongue. You can also take this medicine 5 to 10 minutes before an event likely to produce chest pain. Follow the directions on the prescription label. Let the tablet dissolve under the tongue. Do not swallow whole. Replace the dose if you accidentally swallow it. It will help if your mouth is not dry. Saliva around the tablet will help it to dissolve more quickly. Do not eat or drink, smoke or chew tobacco while a tablet is dissolving. If you are not better within 5 minutes after taking ONE dose of nitroglycerin, call 9-1-1 immediately to seek emergency medical care. Do not take more than 3 nitroglycerin tablets over 15 minutes. If you take this medicine often to relieve symptoms of angina, your doctor or health care professional may provide you with different instructions to manage your symptoms. If symptoms do not go away after following these instructions, it is important to call 9-1-1 immediately. Do not take more than 3 nitroglycerin tablets over 15 minutes. Talk to your pediatrician regarding the use of this medicine in children. Special care may be needed. Overdosage: If you think you have taken too much of this medicine contact a poison control center or emergency room at once. NOTE: This medicine is only for you. Do not share this medicine with others. What if I miss a dose? This does not apply. This medicine is only used as needed. What may interact with this medicine? Do not take this medicine with any of the following medications: -certain migraine medicines like ergotamine and dihydroergotamine (DHE) -medicines used to treat erectile dysfunction like sildenafil, tadalafil, and vardenafil -riociguat  This medicine may also interact with the following medications: -alteplase -aspirin -heparin -medicines for high blood  pressure -medicines for mental depression -other medicines used to treat angina -phenothiazines like chlorpromazine, mesoridazine, prochlorperazine, thioridazine This list may not describe all possible interactions. Give your health care provider a list of all the medicines, herbs, non-prescription drugs, or dietary supplements you use. Also tell them if you smoke, drink alcohol, or use illegal drugs. Some items may interact with your medicine. What should I watch for while using this medicine? Tell your doctor or health care professional if you feel your medicine is no longer working. Keep this medicine with you at all times. Sit or lie down when you take your medicine to prevent falling if you feel dizzy or faint after using it. Try to remain calm. This will help you to feel better faster. If you feel dizzy, take several deep breaths and lie down with your feet propped up, or bend forward with your head resting between your knees. You may get drowsy or dizzy. Do not drive, use machinery, or do anything that needs mental alertness until you know how this drug affects you. Do not stand or sit up quickly, especially if you are an older patient. This reduces the risk of dizzy or fainting spells. Alcohol can make you more drowsy and dizzy. Avoid alcoholic drinks. Do not treat yourself for coughs, colds, or pain while you are taking this medicine without asking your doctor or health care professional for advice. Some ingredients may increase your blood pressure. What side effects may I notice from receiving this medicine? Side effects that you should report to your doctor or health care professional as soon as possible: -blurred vision -dry mouth -skin rash -sweating -the feeling of extreme pressure in the head -unusually weak or tired Side effects that usually do not require medical attention (report to your doctor or health care professional if they continue or are bothersome): -flushing of the face  or neck -headache -irregular heartbeat, palpitations -nausea, vomiting This list may not describe all possible side effects. Call your doctor for medical advice about side effects. You may report side effects to FDA at 1-800-FDA-1088. Where should I keep my medicine? Keep out of the reach of children. Store at room temperature between 20 and 25 degrees C (68 and 77 degrees F). Store in [employer]. Protect from light and moisture. Keep tightly closed. Throw away any unused medicine after the expiration date. NOTE: This sheet is a summary. It may not cover all possible information. If you have questions about this medicine, talk to your doctor, pharmacist, or health care provider.  2015, [employer]vier/[employer] Standard. (2013-05-21 17:57:36)

## 2015-05-12 ENCOUNTER — Ambulatory Visit (HOSPITAL_COMMUNITY)
Admission: RE | Admit: 2015-05-12 | Discharge: 2015-05-12 | Disposition: A | Discharge status: Home / Self Care | Payer: Federal, State, Local not specified - PPO | Source: Ambulatory Visit | Attending: Interventional Cardiology | Admitting: Interventional Cardiology

## 2015-05-12 ENCOUNTER — Other Ambulatory Visit: Payer: Self-pay

## 2015-05-12 ENCOUNTER — Encounter (HOSPITAL_COMMUNITY)
Admission: RE | Disposition: A | Discharge status: Home / Self Care | Payer: Federal, State, Local not specified - PPO | Source: Ambulatory Visit | Attending: Interventional Cardiology

## 2015-05-12 DIAGNOSIS — K219 Gastro-esophageal reflux disease without esophagitis: Secondary | ICD-10-CM | POA: Diagnosis not present

## 2015-05-12 DIAGNOSIS — E785 Hyperlipidemia, unspecified: Secondary | ICD-10-CM | POA: Diagnosis not present

## 2015-05-12 DIAGNOSIS — G4733 Obstructive sleep apnea (adult) (pediatric): Secondary | ICD-10-CM | POA: Diagnosis not present

## 2015-05-12 DIAGNOSIS — Z87891 Personal history of nicotine dependence: Secondary | ICD-10-CM | POA: Diagnosis not present

## 2015-05-12 DIAGNOSIS — Z8249 Family history of ischemic heart disease and other diseases of the circulatory system: Secondary | ICD-10-CM | POA: Insufficient documentation

## 2015-05-12 DIAGNOSIS — I10 Essential (primary) hypertension: Secondary | ICD-10-CM | POA: Insufficient documentation

## 2015-05-12 DIAGNOSIS — I2510 Atherosclerotic heart disease of native coronary artery without angina pectoris: Secondary | ICD-10-CM | POA: Diagnosis not present

## 2015-05-12 DIAGNOSIS — M1711 Unilateral primary osteoarthritis, right knee: Secondary | ICD-10-CM | POA: Diagnosis not present

## 2015-05-12 DIAGNOSIS — R079 Chest pain, unspecified: Secondary | ICD-10-CM | POA: Insufficient documentation

## 2015-05-12 DIAGNOSIS — I209 Angina pectoris, unspecified: Secondary | ICD-10-CM | POA: Diagnosis present

## 2015-05-12 DIAGNOSIS — I25119 Atherosclerotic heart disease of native coronary artery with unspecified angina pectoris: Secondary | ICD-10-CM | POA: Diagnosis not present

## 2015-05-12 DIAGNOSIS — D151 Benign neoplasm of heart: Secondary | ICD-10-CM

## 2015-05-12 HISTORY — PX: CARDIAC CATHETERIZATION: SHX172

## 2015-05-12 SURGERY — LEFT HEART CATH AND CORONARY ANGIOGRAPHY

## 2015-05-12 MED ORDER — MIDAZOLAM HCL 2 MG/2ML IJ SOLN
INTRAMUSCULAR | Status: AC
  Filled 2015-05-12: qty 4

## 2015-05-12 MED ORDER — SODIUM CHLORIDE 0.9 % IV SOLN: 250.0000 mL | INTRAVENOUS | Status: DC | PRN

## 2015-05-12 MED ORDER — SODIUM CHLORIDE 0.9 % WEIGHT BASED INFUSION: 3.0000 mL/kg/h | INTRAVENOUS | Status: DC

## 2015-05-12 MED ORDER — HEPARIN SODIUM (PORCINE) 1000 UNIT/ML IJ SOLN
INTRAMUSCULAR | Status: AC
  Filled 2015-05-12: qty 1

## 2015-05-12 MED ORDER — ACETAMINOPHEN 325 MG PO TABS: 650.0000 mg | ORAL_TABLET | ORAL | Status: DC | PRN

## 2015-05-12 MED ORDER — HEPARIN (PORCINE) IN NACL 2-0.9 UNIT/ML-% IJ SOLN
INTRAMUSCULAR | Status: AC
  Filled 2015-05-12: qty 1000

## 2015-05-12 MED ORDER — SODIUM CHLORIDE 0.9 % WEIGHT BASED INFUSION: 1.0000 mL/kg/h | INTRAVENOUS | Status: DC

## 2015-05-12 MED ORDER — ONDANSETRON HCL 4 MG/2ML IJ SOLN: 4.0000 mg | Freq: Four times a day (QID) | INTRAMUSCULAR | Status: DC | PRN

## 2015-05-12 MED ORDER — MIDAZOLAM HCL 2 MG/2ML IJ SOLN
INTRAMUSCULAR | Status: DC | PRN
  Administered 2015-05-12 (×2): 1 mg via INTRAVENOUS

## 2015-05-12 MED ORDER — FENTANYL CITRATE (PF) 100 MCG/2ML IJ SOLN
INTRAMUSCULAR | Status: AC
  Filled 2015-05-12: qty 4

## 2015-05-12 MED ORDER — HEPARIN SODIUM (PORCINE) 1000 UNIT/ML IJ SOLN
INTRAMUSCULAR | Status: DC | PRN
  Administered 2015-05-12: 4500 [IU] via INTRAVENOUS

## 2015-05-12 MED ORDER — LIDOCAINE HCL (PF) 1 % IJ SOLN
INTRAMUSCULAR | Status: AC
  Filled 2015-05-12: qty 30

## 2015-05-12 MED ORDER — SODIUM CHLORIDE 0.9 % IJ SOLN: 3.0000 mL | INTRAMUSCULAR | Status: DC | PRN

## 2015-05-12 MED ORDER — SODIUM CHLORIDE 0.9 % IJ SOLN: 3.0000 mL | Freq: Two times a day (BID) | INTRAMUSCULAR | Status: DC

## 2015-05-12 MED ORDER — SODIUM CHLORIDE 0.9 % WEIGHT BASED INFUSION
3.0000 mL/kg/h | INTRAVENOUS | Status: DC
  Administered 2015-05-12: 3 mL/kg/h via INTRAVENOUS

## 2015-05-12 MED ORDER — ASPIRIN 81 MG PO CHEW
CHEWABLE_TABLET | ORAL | Status: AC
  Filled 2015-05-12: qty 1

## 2015-05-12 MED ORDER — HEPARIN (PORCINE) IN NACL 2-0.9 UNIT/ML-% IJ SOLN
INTRAMUSCULAR | Status: DC | PRN
  Administered 2015-05-12: 11:00:00 via INTRA_ARTERIAL

## 2015-05-12 MED ORDER — ASPIRIN 81 MG PO CHEW
81.0000 mg | CHEWABLE_TABLET | ORAL | Status: AC
  Administered 2015-05-12: 81 mg via ORAL

## 2015-05-12 MED ORDER — VERAPAMIL HCL 2.5 MG/ML IV SOLN
INTRAVENOUS | Status: AC
  Filled 2015-05-12: qty 2

## 2015-05-12 MED ORDER — FENTANYL CITRATE (PF) 100 MCG/2ML IJ SOLN
INTRAMUSCULAR | Status: DC | PRN
  Administered 2015-05-12: 50 ug via INTRAVENOUS

## 2015-05-12 SURGICAL SUPPLY — 9 items
CATH INFINITI 5 FR JL3.5 (CATHETERS) ×2 IMPLANT
CATH INFINITI JR4 5F (CATHETERS) ×2 IMPLANT
DEVICE RAD COMP TR BAND LRG (VASCULAR PRODUCTS) ×2 IMPLANT
GLIDESHEATH SLEND A-KIT 6F 22G (SHEATH) ×2 IMPLANT
KIT HEART LEFT (KITS) ×2 IMPLANT
PACK CARDIAC CATHETERIZATION (CUSTOM PROCEDURE TRAY) ×2 IMPLANT
TRANSDUCER W/STOPCOCK (MISCELLANEOUS) ×2 IMPLANT
TUBING CIL FLEX 10 FLL-RA (TUBING) ×2 IMPLANT
WIRE SAFE-T 1.5MM-J .035X260CM (WIRE) ×2 IMPLANT

## 2015-05-12 NOTE — Interval H&P Note (Signed)
Cath Lab Visit (complete for each Cath Lab visit)  Clinical Evaluation Leading to the Procedure:   ACS: Yes.    Non-ACS:    Anginal Classification: CCS III  Anti-ischemic medical therapy: Maximal Therapy (2 or more classes of medications)  Non-Invasive Test Results: No non-invasive testing performed  Prior CABG: No previous CABG      History and Physical Interval Note:  05/12/2015 10:52 AM  Blake Diaz  has presented today for surgery, with the diagnosis of angina  The various methods of treatment have been discussed with the patient and family. After consideration of risks, benefits and other options for treatment, the patient has consented to  Procedure(s): Left Heart Cath and Coronary Angiography (N/A) as a surgical intervention .  The patient's history has been reviewed, patient examined, no change in status, stable for surgery.  I have reviewed the patient's chart and labs.  Questions were answered to the patient's satisfaction.     Sinclair Grooms

## 2015-05-12 NOTE — Research (Signed)
[place] Study  Informed Consent   Subject Name: Blake Diaz  Subject met inclusion and exclusion criteria.  The informed consent form, study requirements and expectations were reviewed with the subject and questions and concerns were addressed prior to the signing of the consent form.  The subject verbalized understanding of the trial requirements.  The subject agreed to participate in the CADLAD trial and signed the informed consent.  The informed consent was obtained prior to performance of any protocol-specific procedures for the subject.  A copy of the signed informed consent was given to the subject and a copy was placed in the subject's medical record.  Blossom Hoops 05/12/2015, 1:27 PM

## 2015-05-12 NOTE — H&P (View-Only) (Signed)
Cardiology Office Note   Date:  05/09/2015   ID:  Blake Diaz, DOB [birth year], MRN [id number]  PCP:  No PCP Per Patient  Cardiologist:  Sinclair Grooms, MD   Chief Complaint  Patient presents with  . Chest Pain      History of Present Illness: Blake Diaz is a 59 y.o. male who presents for hyperlipidemia, hypertension, prior smoker, and family history of CAD.  59 year old gentleman with prior heavy cigarette smoking history but discontinued 30 years ago. Over the last 2-4 months he has experienced reproducible left lateral chest aching and left arm numbness that is relieved with rest. He is more likely to experience the discomfort when it involves upper extremity repetitive motion as well as walking or other physical activity. The discomfort caused him to take 3 times longer power washing his house when recently attempted. Rest relieves the discomfort. He has had one episode at rest. That episode occurred after heavy physical activity where he was mowing using a push mower. He has had the discomfort at least 10 times over the past several months.    Past Medical History  Diagnosis Date  . Hypertension     presently taking Lisinopril sparingly  . Sleep apnea     nasal prongs, cpap used occ.  Marland Kitchen GERD (gastroesophageal reflux disease)     acid reflux uses OTS "TUMS"  . Arthritis     osteoarthritis right knee  . Elevated liver enzymes     history of "never any continued follow up"-"not a problem"    Past Surgical History  Procedure Laterality Date  . Shoulder surgery Right     open -bone spur and ligament tears"some limited ROM"  . Hand surgery Left     trigger finger  . Hernia repair      Umbilical, bilateral inguinal hernis repair  . Total knee arthroplasty Right 11/08/2014    Procedure: RIGHT TOTAL KNEE ARTHROPLASTY;  Surgeon: Paralee Cancel, MD;  Location: [facility];  Service: Orthopedics;  Laterality: Right;     Current Outpatient Prescriptions  Medication Sig  Dispense Refill  . Cyanocobalamin (VITAMIN B-12 PO) Take 1 tablet by mouth daily.    Marland Kitchen desonide (DESOWEN) 0.05 % cream Apply 1 application topically 2 (two) times daily as needed (Itching).     Marland Kitchen docusate sodium (COLACE) 100 MG capsule Take 1 capsule (100 mg total) by mouth 2 (two) times daily. 10 capsule 0  . ferrous sulfate 325 (65 FE) MG tablet Take 1 tablet (325 mg total) by mouth 3 (three) times daily after meals.  3  . fish oil-omega-3 fatty acids 1000 MG capsule Take 1 g by mouth daily.      Marland Kitchen HYDROcodone-acetaminophen (NORCO) 7.5-325 MG per tablet Take 1-2 tablets by mouth every 4 (four) hours as needed for moderate pain. 100 tablet 0  . lisinopril (PRINIVIL,ZESTRIL) 30 MG tablet Take 30 mg by mouth daily.    . methocarbamol (ROBAXIN) 500 MG tablet Take 1 tablet (500 mg total) by mouth every 6 (six) hours as needed for muscle spasms. 50 tablet 0  . Multiple Vitamin (MULITIVITAMIN WITH MINERALS) TABS Take 1 tablet by mouth daily.      Marland Kitchen omeprazole (PRILOSEC) 20 MG capsule Take 20 mg by mouth daily.    . polyethylene glycol (MIRALAX / GLYCOLAX) packet Take 17 g by mouth 2 (two) times daily. 14 each 0  . vitamin E (VITAMIN E) 400 UNIT capsule Take 400 Units by mouth daily.  No current facility-administered medications for this visit.    Allergies:   Augmentin    Social History:  The patient  reports that he quit smoking about 20 years ago. His smoking use included Cigarettes. He does not have any smokeless tobacco history on file. He reports that he does not drink alcohol or use illicit drugs.   Family History:  The patient's family history includes Healthy in his daughter and son; Heart disease in his brother; Hypertension in his mother; Other in his mother.    ROS:  Please see the history of present illness.   Otherwise, review of systems are positive for nuclear perfusion study from greater than 5 years ago was some question of abnormality (the report is not available)  occasional leg swelling, dyspnea on exertion, difficulty with balance..   All other systems are reviewed and negative.    PHYSICAL EXAM: VS:  BP 130/80 mmHg  Pulse 67  Ht 5\' 11"  (1.803 m)  Wt 89.994 kg (198 lb 6.4 oz)  BMI 27.68 kg/m2 , BMI Body mass index is 27.68 kg/(m^2). GEN: Well nourished, well developed, in no acute distress HEENT: normal Neck: no JVD, carotid bruits, or masses Cardiac: RRR.  There is no murmur, rub, or gallop. There is no edema. Respiratory:  clear to auscultation bilaterally, normal work of breathing. GI: soft, nontender, nondistended, + BS MS: no deformity or atrophy Skin: warm and dry, no rash Neuro:  Strength and sensation are intact Psych: euthymic mood, full affect   EKG:  EKG is  ordered today. The ekg reveals normal   Recent Labs: 10/27/2014: ALT 54* 11/09/2014: BUN 8; Creatinine, Ser 0.72; Hemoglobin 12.2*; Platelets 186; Potassium 3.9; Sodium 135    Lipid Panel No results found for: CHOL, TRIG, HDL, CHOLHDL, VLDL, LDLCALC, LDLDIRECT    Wt Readings from Last 3 Encounters:  05/09/15 89.994 kg (198 lb 6.4 oz)  11/08/14 90.719 kg (200 lb)  10/27/14 90.719 kg (200 lb)      Other studies Reviewed: Additional studies/ records that were reviewed today include: Records from [facility].. The findings include awaiting prior nuclear study which according to the patient was not completely normal 5 years ago.    ASSESSMENT AND PLAN:  1. Chest pain consistent with angina pectoris The story of reproducible left precordial and axillary discomfort with radiation and numbness into the left arm occurring during physical activity that requires repetitive arm motion is compatible with angina pectoris.  2. OSA (obstructive sleep apnea) Wears C Pap  3. Essential hypertension Good control  4. Hyperlipidemia    Current medicines are reviewed at length with the patient today.  The patient has the following concerns regarding medicines: Use of  nitroglycerin.  The following changes/actions have been instituted:    Nitroglycerin 0.4 mg to be used for any prolonged discomfort lasting greater than 5 minutes  Avoid activities that precipitate chest discomfort The patient was advised about the risks and benefits of coronary angiography. The risks including death, stroke, kidney failure, heart attack, limb ischemia, bleeding and allergy were discussed and accepted by the patient.  Questions were answered and the patient accepts the procedure and willing to proceed.  Labs/ tests ordered today include:  No orders of the defined types were placed in this encounter.     Disposition:   FU with HS in when necessary   Signed, Sinclair Grooms, MD  05/09/2015 9:35 AM    [address], [place], [place]  [address]  Phone: [phone]; Fax: [phone]

## 2015-05-12 NOTE — Discharge Instructions (Signed)
Radial Site Care °Refer to this sheet in the next few weeks. These instructions provide you with information about caring for yourself after your procedure. Your health care provider may also give you more specific instructions. Your treatment has been planned according to current medical practices, but problems sometimes occur. Call your health care provider if you have any problems or questions after your procedure. °WHAT TO EXPECT AFTER THE PROCEDURE °After your procedure, it is typical to have the following: °· Bruising at the radial site that usually fades within 1-2 weeks. °· Blood collecting in the tissue (hematoma) that may be painful to the touch. It should usually decrease in size and tenderness within 1-2 weeks. °HOME CARE INSTRUCTIONS °· Take medicines only as directed by your health care provider. °· You may shower 24-48 hours after the procedure or as directed by your health care provider. Remove the bandage (dressing) and gently wash the site with plain soap and water. Pat the area dry with a clean towel. Do not rub the site, because this may cause bleeding. °· Do not take baths, swim, or use a hot tub until your health care provider approves. °· Check your insertion site every day for redness, swelling, or drainage. °· Do not apply powder or lotion to the site. °· Do not flex or bend the affected arm for 24 hours or as directed by your health care provider. °· Do not push or pull heavy objects with the affected arm for 24 hours or as directed by your health care provider. °· Do not lift over 10 lb (4.5 kg) for 5 days after your procedure or as directed by your health care provider. °· Ask your health care provider when it is okay to: °¨ Return to work or school. °¨ Resume usual physical activities or sports. °¨ Resume sexual activity. °· Do not drive home if you are discharged the same day as the procedure. Have someone else drive you. °· You may drive 24 hours after the procedure unless otherwise  instructed by your health care provider. °· Do not operate machinery or power tools for 24 hours after the procedure. °· If your procedure was done as an outpatient procedure, which means that you went home the same day as your procedure, a responsible adult should be with you for the first 24 hours after you arrive home. °· Keep all follow-up visits as directed by your health care provider. This is important. °SEEK MEDICAL CARE IF: °· You have a fever. °· You have chills. °· You have increased bleeding from the radial site. Hold pressure on the site and call 911. °SEEK IMMEDIATE MEDICAL CARE IF: °· You have unusual pain at the radial site. °· You have redness, warmth, or swelling at the radial site. °· You have drainage (other than a small amount of blood on the dressing) from the radial site. °· The radial site is bleeding, and the bleeding does not stop after 30 minutes of holding steady pressure on the site. °· Your arm or hand becomes pale, cool, tingly, or numb. °  °This information is not intended to replace advice given to you by your health care provider. Make sure you discuss any questions you have with your health care provider. °  °Document Released: 08/25/2010 Document Revised: 08/13/2014 Document Reviewed: 02/08/2014 °Elsevier Interactive Patient Education ©2016 [employer] ° °

## 2015-05-13 ENCOUNTER — Encounter (HOSPITAL_COMMUNITY): Payer: Self-pay | Admitting: Interventional Cardiology

## 2015-05-13 MED FILL — Lidocaine HCl Local Preservative Free (PF) Inj 1%: INTRAMUSCULAR | Qty: 30 | Status: AC

## 2015-05-23 ENCOUNTER — Other Ambulatory Visit: Payer: Self-pay

## 2015-05-23 ENCOUNTER — Ambulatory Visit (HOSPITAL_COMMUNITY): Payer: Federal, State, Local not specified - PPO | Attending: Cardiovascular Disease

## 2015-05-23 DIAGNOSIS — I071 Rheumatic tricuspid insufficiency: Secondary | ICD-10-CM | POA: Insufficient documentation

## 2015-05-23 DIAGNOSIS — D151 Benign neoplasm of heart: Secondary | ICD-10-CM

## 2015-05-23 DIAGNOSIS — I340 Nonrheumatic mitral (valve) insufficiency: Secondary | ICD-10-CM | POA: Diagnosis not present

## 2015-05-24 ENCOUNTER — Telehealth: Payer: Self-pay

## 2015-05-24 NOTE — Telephone Encounter (Signed)
Not unless other issues arise.Heart is okay.

## 2015-05-24 NOTE — Telephone Encounter (Signed)
-----   Message from Belva Crome, MD sent at 05/23/2015  2:07 PM EDT ----- Normal.

## 2015-05-24 NOTE — Telephone Encounter (Signed)
Called to give pt echo results.lmtcb 

## 2015-05-24 NOTE — Telephone Encounter (Signed)
F/u       Pt returning [name]'s phone call.

## 2015-05-24 NOTE — Telephone Encounter (Signed)
Pt aware of echo results. Normal. Pt would like to know if  f/u with Dr.Smith is needed. Adv pt I will fwd him a message and call back with his response. Pt verbalized understanding.

## 2015-05-25 NOTE — Telephone Encounter (Signed)
Lmom. Pt can f/u with Dr.Smith PRN

## 2015-06-03 ENCOUNTER — Encounter: Payer: Self-pay | Admitting: Interventional Cardiology

## 2015-10-10 ENCOUNTER — Ambulatory Visit
Admission: RE | Admit: 2015-10-10 | Discharge: 2015-10-10 | Disposition: A | Discharge status: Home / Self Care | Payer: Federal, State, Local not specified - PPO | Source: Ambulatory Visit | Attending: Internal Medicine | Admitting: Internal Medicine

## 2015-10-10 ENCOUNTER — Other Ambulatory Visit: Payer: Self-pay | Admitting: Internal Medicine

## 2015-10-10 DIAGNOSIS — R059 Cough, unspecified: Secondary | ICD-10-CM

## 2015-10-10 DIAGNOSIS — R05 Cough: Secondary | ICD-10-CM

## 2016-05-25 DIAGNOSIS — J0110 Acute frontal sinusitis, unspecified: Secondary | ICD-10-CM | POA: Diagnosis not present

## 2016-07-10 DIAGNOSIS — K080 Exfoliation of teeth due to systemic causes: Secondary | ICD-10-CM | POA: Diagnosis not present

## 2016-09-03 DIAGNOSIS — M79674 Pain in right toe(s): Secondary | ICD-10-CM | POA: Diagnosis not present

## 2016-09-03 DIAGNOSIS — R748 Abnormal levels of other serum enzymes: Secondary | ICD-10-CM | POA: Diagnosis not present

## 2016-09-03 DIAGNOSIS — I10 Essential (primary) hypertension: Secondary | ICD-10-CM | POA: Diagnosis not present

## 2016-09-03 DIAGNOSIS — K439 Ventral hernia without obstruction or gangrene: Secondary | ICD-10-CM | POA: Diagnosis not present

## 2016-09-22 ENCOUNTER — Emergency Department (HOSPITAL_COMMUNITY)
Admission: EM | Admit: 2016-09-22 | Discharge: 2016-09-22 | Disposition: A | Discharge status: Home / Self Care | Payer: Federal, State, Local not specified - PPO | Attending: Emergency Medicine | Admitting: Emergency Medicine

## 2016-09-22 ENCOUNTER — Encounter (HOSPITAL_COMMUNITY): Payer: Self-pay | Admitting: Emergency Medicine

## 2016-09-22 ENCOUNTER — Emergency Department (HOSPITAL_COMMUNITY): Payer: Federal, State, Local not specified - PPO

## 2016-09-22 DIAGNOSIS — S82401A Unspecified fracture of shaft of right fibula, initial encounter for closed fracture: Secondary | ICD-10-CM

## 2016-09-22 DIAGNOSIS — S299XXA Unspecified injury of thorax, initial encounter: Secondary | ICD-10-CM | POA: Diagnosis present

## 2016-09-22 DIAGNOSIS — Y939 Activity, unspecified: Secondary | ICD-10-CM | POA: Insufficient documentation

## 2016-09-22 DIAGNOSIS — Z96651 Presence of right artificial knee joint: Secondary | ICD-10-CM | POA: Insufficient documentation

## 2016-09-22 DIAGNOSIS — W228XXA Striking against or struck by other objects, initial encounter: Secondary | ICD-10-CM | POA: Insufficient documentation

## 2016-09-22 DIAGNOSIS — Y999 Unspecified external cause status: Secondary | ICD-10-CM | POA: Diagnosis not present

## 2016-09-22 DIAGNOSIS — S0990XA Unspecified injury of head, initial encounter: Secondary | ICD-10-CM | POA: Insufficient documentation

## 2016-09-22 DIAGNOSIS — M25561 Pain in right knee: Secondary | ICD-10-CM | POA: Diagnosis not present

## 2016-09-22 DIAGNOSIS — Z7982 Long term (current) use of aspirin: Secondary | ICD-10-CM | POA: Insufficient documentation

## 2016-09-22 DIAGNOSIS — S8991XA Unspecified injury of right lower leg, initial encounter: Secondary | ICD-10-CM

## 2016-09-22 DIAGNOSIS — Z79899 Other long term (current) drug therapy: Secondary | ICD-10-CM | POA: Insufficient documentation

## 2016-09-22 DIAGNOSIS — Y929 Unspecified place or not applicable: Secondary | ICD-10-CM | POA: Insufficient documentation

## 2016-09-22 DIAGNOSIS — I10 Essential (primary) hypertension: Secondary | ICD-10-CM | POA: Diagnosis not present

## 2016-09-22 DIAGNOSIS — S0101XA Laceration without foreign body of scalp, initial encounter: Secondary | ICD-10-CM | POA: Diagnosis not present

## 2016-09-22 DIAGNOSIS — R0781 Pleurodynia: Secondary | ICD-10-CM | POA: Diagnosis not present

## 2016-09-22 DIAGNOSIS — S8990XA Unspecified injury of unspecified lower leg, initial encounter: Secondary | ICD-10-CM | POA: Diagnosis not present

## 2016-09-22 DIAGNOSIS — S82831A Other fracture of upper and lower end of right fibula, initial encounter for closed fracture: Secondary | ICD-10-CM | POA: Diagnosis not present

## 2016-09-22 DIAGNOSIS — T1490XA Injury, unspecified, initial encounter: Secondary | ICD-10-CM

## 2016-09-22 DIAGNOSIS — S2231XA Fracture of one rib, right side, initial encounter for closed fracture: Secondary | ICD-10-CM | POA: Insufficient documentation

## 2016-09-22 DIAGNOSIS — S0003XA Contusion of scalp, initial encounter: Secondary | ICD-10-CM | POA: Diagnosis not present

## 2016-09-22 DIAGNOSIS — S0181XA Laceration without foreign body of other part of head, initial encounter: Secondary | ICD-10-CM | POA: Diagnosis not present

## 2016-09-22 DIAGNOSIS — Z87891 Personal history of nicotine dependence: Secondary | ICD-10-CM | POA: Insufficient documentation

## 2016-09-22 MED ORDER — HYDROCODONE-ACETAMINOPHEN 5-325 MG PO TABS: 1.0000 | ORAL_TABLET | ORAL | 0 refills | Status: DC | PRN

## 2016-09-22 MED ORDER — METHOCARBAMOL 750 MG PO TABS: 750.0000 mg | ORAL_TABLET | Freq: Four times a day (QID) | ORAL | 0 refills | Status: DC

## 2016-09-22 MED ORDER — OXYCODONE-ACETAMINOPHEN 5-325 MG PO TABS
1.0000 | ORAL_TABLET | Freq: Once | ORAL | Status: AC
  Administered 2016-09-22: 1 via ORAL
  Filled 2016-09-22: qty 1

## 2016-09-22 NOTE — ED Notes (Signed)
Patient d/c'd self care.  F/U and medciations reviewed.  Patient verbalized understanding.

## 2016-09-22 NOTE — ED Notes (Signed)
Patient transported to X-ray 

## 2016-09-22 NOTE — ED Provider Notes (Addendum)
[facility] DEPT Provider Note   CSN: [id number] Arrival date & time: 09/22/16  1413     History   Chief Complaint Chief Complaint  Patient presents with  . Knee Injury  . Laceration    HPI ANDRICK GURKIN is a 61 y.o. male.  61 year old male who presents after being struck by the tailgate of a trailer. Was struck on the left side of his posterior scalp and fell onto his right side. Complains of pain to his right ribs characterized as sharp and worse with movement. Denies any dyspnea. Denies any abdominal discomfort. Also notes a sharp right knee pain that is worse with standing as well. He has developed some swelling to the right knee. Also notes right ankle discomfort characterized as sharp and worse with standing as well. Denies any neck pain. No confusion. No nausea vomiting. Took 2 Motrin without relief. Called EMS and was transported here.      Past Medical History:  Diagnosis Date  . Arthritis    osteoarthritis right knee  . Elevated liver enzymes    history of "never any continued follow up"-"not a problem"  . GERD (gastroesophageal reflux disease)    acid reflux uses OTS "TUMS"  . Hypertension    presently taking Lisinopril sparingly  . Sleep apnea    nasal prongs, cpap used occ.    Patient Active Problem List   Diagnosis Date Noted  . Chest pain   . Angina pectoris 05/09/2015  . Hyperlipidemia 05/09/2015  . OSA (obstructive sleep apnea) 05/08/2015  . Essential hypertension 05/08/2015  . S/P right TKA 11/08/2014  . S/P knee replacement 11/08/2014    Past Surgical History:  Procedure Laterality Date  . CARDIAC CATHETERIZATION N/A 05/12/2015   Procedure: Left Heart Cath and Coronary Angiography;  Surgeon: Belva Crome, MD;  Location: [facility];  Service: Cardiovascular;  Laterality: N/A;  . HAND SURGERY Left    trigger finger  . HERNIA REPAIR     Umbilical, bilateral inguinal hernis repair  . SHOULDER SURGERY Right    open -bone spur and  ligament tears"some limited ROM"  . TOTAL KNEE ARTHROPLASTY Right 11/08/2014   Procedure: RIGHT TOTAL KNEE ARTHROPLASTY;  Surgeon: Paralee Cancel, MD;  Location: [facility];  Service: Orthopedics;  Laterality: Right;       Home Medications    Prior to Admission medications   Medication Sig Start Date End Date Taking? Authorizing Provider  aspirin EC 81 MG tablet Take 1 tablet (81 mg total) by mouth daily. 05/09/15   Belva Crome, MD  Cyanocobalamin (VITAMIN B-12 PO) Take 1 tablet by mouth daily.    Historical Provider, MD  desonide (DESOWEN) 0.05 % cream Apply 1 application topically 2 (two) times daily as needed (Itching).     Historical Provider, MD  docusate sodium (COLACE) 100 MG capsule Take 1 capsule (100 mg total) by mouth 2 (two) times daily. Patient taking differently: Take 100 mg by mouth 2 (two) times daily as needed for mild constipation.  11/09/14   Danae Orleans, PA-C  ferrous sulfate 325 (65 FE) MG tablet Take 1 tablet (325 mg total) by mouth 3 (three) times daily after meals. Patient taking differently: Take 325 mg by mouth daily.  11/09/14   Danae Orleans, PA-C  fish oil-omega-3 fatty acids 1000 MG capsule Take 1 g by mouth daily.      Historical Provider, MD  HYDROcodone-acetaminophen (NORCO) 7.5-325 MG per tablet Take 1-2 tablets by mouth every 4 (four) hours  as needed for moderate pain. Patient not taking: Reported on 05/10/2015 11/09/14   Danae Orleans, PA-C  lisinopril (PRINIVIL,ZESTRIL) 30 MG tablet Take 30 mg by mouth daily. 04/19/15   Historical Provider, MD  methocarbamol (ROBAXIN) 500 MG tablet Take 1 tablet (500 mg total) by mouth every 6 (six) hours as needed for muscle spasms. Patient not taking: Reported on 05/10/2015 11/09/14   Danae Orleans, PA-C  Multiple Vitamin (MULITIVITAMIN WITH MINERALS) TABS Take 1 tablet by mouth daily.      Historical Provider, MD  nitroGLYCERIN (NITROSTAT) 0.4 MG SL tablet Place 1 tablet (0.4 mg total) under the tongue every 5 (five) minutes as  needed. 05/09/15   Belva Crome, MD  omeprazole (PRILOSEC) 20 MG capsule Take 20 mg by mouth daily.    Historical Provider, MD  polyethylene glycol (MIRALAX / GLYCOLAX) packet Take 17 g by mouth 2 (two) times daily. Patient not taking: Reported on 05/10/2015 11/09/14   Danae Orleans, PA-C  vitamin E (VITAMIN E) 400 UNIT capsule Take 400 Units by mouth daily.      Historical Provider, MD    Family History Family History  Problem Relation Age of Onset  . Other Mother     BLOOD DISEASE  . Hypertension Mother   . Healthy Daughter   . Healthy Son   . Heart disease Brother     HTN, HIGH CHOLESTOROL    Social History Social History  Substance Use Topics  . Smoking status: Former Smoker    Types: Cigarettes    Quit date: 10/27/1994  . Smokeless tobacco: Not on file  . Alcohol use No     Allergies   Augmentin [amoxicillin-pot clavulanate]   Review of Systems Review of Systems  All other systems reviewed and are negative.    Physical Exam Updated Vital Signs BP 182/99 (BP Location: Left Arm)   Pulse 93   Temp 97.8 F (36.6 C) (Oral)   Resp 18   SpO2 98%   Physical Exam  Constitutional: He is oriented to person, place, and time. He appears well-developed and well-nourished.  Non-toxic appearance. No distress.  HENT:  Head: Normocephalic and atraumatic.    Eyes: Conjunctivae, EOM and lids are normal. Pupils are equal, round, and reactive to light.  Neck: Normal range of motion. Neck supple. No tracheal deviation present. No thyroid mass present.  Cardiovascular: Normal rate, regular rhythm and normal heart sounds.  Exam reveals no gallop.   No murmur heard. Pulmonary/Chest: Effort normal and breath sounds normal. No stridor. No respiratory distress. He has no decreased breath sounds. He has no wheezes. He has no rhonchi. He has no rales.    Abdominal: Soft. Normal appearance and bowel sounds are normal. He exhibits no distension. There is no tenderness. There is no  rebound and no CVA tenderness.  Musculoskeletal: Normal range of motion. He exhibits no edema or tenderness.       Right knee: He exhibits swelling and effusion.       Right ankle: He exhibits swelling. He exhibits normal range of motion, no deformity and no laceration.       Feet:  Neurological: He is alert and oriented to person, place, and time. He has normal strength. No cranial nerve deficit or sensory deficit. GCS eye subscore is 4. GCS verbal subscore is 5. GCS motor subscore is 6.  Skin: Skin is warm and dry. No abrasion and no rash noted.  Psychiatric: He has a normal mood and affect. His speech is  normal and behavior is normal.  Nursing note and vitals reviewed.    ED Treatments / Results  Labs (all labs ordered are listed, but only abnormal results are displayed) Labs Reviewed - No data to display  EKG  EKG Interpretation None       Radiology No results found.  Procedures Procedures (including critical care time)  Medications Ordered in ED Medications - No data to display   Initial Impression / Assessment and Plan / ED Course  I have reviewed the triage vital signs and the nursing notes.  Pertinent labs & imaging results that were available during my care of the patient were reviewed by me and considered in my medical decision making (see chart for details).    LACERATION REPAIR Performed by: Leota Jacobsen Authorized by: Leota Jacobsen Consent: Verbal consent obtained. Risks and benefits: risks, benefits and alternatives were discussed Consent given by: patient Patient identity confirmed: provided demographic data Prepped and Draped in normal sterile fashion Wound explored  Laceration Location: Left parietal scalp  Laceration Length: 3 cm  No Foreign Bodies seen or palpated  Anesthesia: local infiltration      Irrigation method: syringe Amount of cleaning: standard  Skin closure: Staples   Number of staples: 7   Technique: Simple    Patient tolerance: Patient tolerated the procedure well with no immediate complications.  Final Clinical Impressions(s) / ED Diagnoses   Final diagnoses:  Trauma  Patient place in ankle stirrup splint by orthopedic tech. Also placed in a knee immobilizer. Patient medicated for pain here with Percocet. Will prescribe patient Percocet as well as Robaxin and he will follow-up with his orthopedic surgeon.  Head Ct results noted and pt w/o sx of sinusitis and therefore will not treat  New Prescriptions New Prescriptions   No medications on file     Lacretia Leigh, MD 09/22/16 [place], MD 09/23/16 [phone]

## 2016-09-22 NOTE — ED Notes (Signed)
RN cleaned head LAC.  Staple gun bedside.

## 2016-09-22 NOTE — Discharge Instructions (Signed)
Have the staples removed in 7-10 days.

## 2016-09-22 NOTE — ED Triage Notes (Signed)
Pt is from home where he was attempting to lift a tailgate of a trailer when it came back down on him.  No LOC.  Pt heard his R knee pop but did make it back to his house.  Left head LAC.  S/P R knee replacement surgery, Dr. [name] preformed the surgery, unclear as to when.

## 2016-09-22 NOTE — ED Notes (Signed)
Bed: WA20 Expected date: 09/22/16 Expected time: 2:10 PM Means of arrival: Ambulance Comments: Object fell on pt

## 2016-09-24 DIAGNOSIS — Z471 Aftercare following joint replacement surgery: Secondary | ICD-10-CM | POA: Diagnosis not present

## 2016-09-24 DIAGNOSIS — Z96651 Presence of right artificial knee joint: Secondary | ICD-10-CM | POA: Diagnosis not present

## 2016-09-24 DIAGNOSIS — S82831A Other fracture of upper and lower end of right fibula, initial encounter for closed fracture: Secondary | ICD-10-CM | POA: Diagnosis not present

## 2016-09-24 DIAGNOSIS — M25571 Pain in right ankle and joints of right foot: Secondary | ICD-10-CM | POA: Diagnosis not present

## 2016-09-29 DIAGNOSIS — S0101XD Laceration without foreign body of scalp, subsequent encounter: Secondary | ICD-10-CM | POA: Diagnosis not present

## 2016-10-24 DIAGNOSIS — S82831D Other fracture of upper and lower end of right fibula, subsequent encounter for closed fracture with routine healing: Secondary | ICD-10-CM | POA: Diagnosis not present

## 2016-10-24 DIAGNOSIS — Z96651 Presence of right artificial knee joint: Secondary | ICD-10-CM | POA: Diagnosis not present

## 2016-10-24 DIAGNOSIS — Z471 Aftercare following joint replacement surgery: Secondary | ICD-10-CM | POA: Diagnosis not present

## 2017-04-29 DIAGNOSIS — M9902 Segmental and somatic dysfunction of thoracic region: Secondary | ICD-10-CM | POA: Diagnosis not present

## 2017-04-29 DIAGNOSIS — M47812 Spondylosis without myelopathy or radiculopathy, cervical region: Secondary | ICD-10-CM | POA: Diagnosis not present

## 2017-04-29 DIAGNOSIS — M9901 Segmental and somatic dysfunction of cervical region: Secondary | ICD-10-CM | POA: Diagnosis not present

## 2017-04-29 DIAGNOSIS — M4716 Other spondylosis with myelopathy, lumbar region: Secondary | ICD-10-CM | POA: Diagnosis not present

## 2017-05-02 DIAGNOSIS — M4716 Other spondylosis with myelopathy, lumbar region: Secondary | ICD-10-CM | POA: Diagnosis not present

## 2017-05-02 DIAGNOSIS — M47812 Spondylosis without myelopathy or radiculopathy, cervical region: Secondary | ICD-10-CM | POA: Diagnosis not present

## 2017-05-02 DIAGNOSIS — M9901 Segmental and somatic dysfunction of cervical region: Secondary | ICD-10-CM | POA: Diagnosis not present

## 2017-05-02 DIAGNOSIS — M9902 Segmental and somatic dysfunction of thoracic region: Secondary | ICD-10-CM | POA: Diagnosis not present

## 2017-05-06 DIAGNOSIS — M9901 Segmental and somatic dysfunction of cervical region: Secondary | ICD-10-CM | POA: Diagnosis not present

## 2017-05-06 DIAGNOSIS — M9902 Segmental and somatic dysfunction of thoracic region: Secondary | ICD-10-CM | POA: Diagnosis not present

## 2017-05-06 DIAGNOSIS — M4716 Other spondylosis with myelopathy, lumbar region: Secondary | ICD-10-CM | POA: Diagnosis not present

## 2017-05-06 DIAGNOSIS — M47812 Spondylosis without myelopathy or radiculopathy, cervical region: Secondary | ICD-10-CM | POA: Diagnosis not present

## 2017-05-09 DIAGNOSIS — M47812 Spondylosis without myelopathy or radiculopathy, cervical region: Secondary | ICD-10-CM | POA: Diagnosis not present

## 2017-05-09 DIAGNOSIS — M9902 Segmental and somatic dysfunction of thoracic region: Secondary | ICD-10-CM | POA: Diagnosis not present

## 2017-05-09 DIAGNOSIS — M9901 Segmental and somatic dysfunction of cervical region: Secondary | ICD-10-CM | POA: Diagnosis not present

## 2017-05-09 DIAGNOSIS — M4716 Other spondylosis with myelopathy, lumbar region: Secondary | ICD-10-CM | POA: Diagnosis not present

## 2017-05-15 DIAGNOSIS — J329 Chronic sinusitis, unspecified: Secondary | ICD-10-CM | POA: Diagnosis not present

## 2017-05-16 DIAGNOSIS — M9901 Segmental and somatic dysfunction of cervical region: Secondary | ICD-10-CM | POA: Diagnosis not present

## 2017-05-16 DIAGNOSIS — M47812 Spondylosis without myelopathy or radiculopathy, cervical region: Secondary | ICD-10-CM | POA: Diagnosis not present

## 2017-05-16 DIAGNOSIS — M4716 Other spondylosis with myelopathy, lumbar region: Secondary | ICD-10-CM | POA: Diagnosis not present

## 2017-05-16 DIAGNOSIS — M9902 Segmental and somatic dysfunction of thoracic region: Secondary | ICD-10-CM | POA: Diagnosis not present

## 2017-05-22 DIAGNOSIS — M47812 Spondylosis without myelopathy or radiculopathy, cervical region: Secondary | ICD-10-CM | POA: Diagnosis not present

## 2017-05-22 DIAGNOSIS — M4716 Other spondylosis with myelopathy, lumbar region: Secondary | ICD-10-CM | POA: Diagnosis not present

## 2017-05-22 DIAGNOSIS — M9901 Segmental and somatic dysfunction of cervical region: Secondary | ICD-10-CM | POA: Diagnosis not present

## 2017-05-22 DIAGNOSIS — M9902 Segmental and somatic dysfunction of thoracic region: Secondary | ICD-10-CM | POA: Diagnosis not present

## 2017-06-05 DIAGNOSIS — M9901 Segmental and somatic dysfunction of cervical region: Secondary | ICD-10-CM | POA: Diagnosis not present

## 2017-06-05 DIAGNOSIS — M4716 Other spondylosis with myelopathy, lumbar region: Secondary | ICD-10-CM | POA: Diagnosis not present

## 2017-06-05 DIAGNOSIS — M9902 Segmental and somatic dysfunction of thoracic region: Secondary | ICD-10-CM | POA: Diagnosis not present

## 2017-06-05 DIAGNOSIS — M47812 Spondylosis without myelopathy or radiculopathy, cervical region: Secondary | ICD-10-CM | POA: Diagnosis not present

## 2017-06-19 DIAGNOSIS — M4716 Other spondylosis with myelopathy, lumbar region: Secondary | ICD-10-CM | POA: Diagnosis not present

## 2017-06-19 DIAGNOSIS — M9901 Segmental and somatic dysfunction of cervical region: Secondary | ICD-10-CM | POA: Diagnosis not present

## 2017-06-19 DIAGNOSIS — M47812 Spondylosis without myelopathy or radiculopathy, cervical region: Secondary | ICD-10-CM | POA: Diagnosis not present

## 2017-06-19 DIAGNOSIS — M9902 Segmental and somatic dysfunction of thoracic region: Secondary | ICD-10-CM | POA: Diagnosis not present

## 2017-07-10 DIAGNOSIS — M9901 Segmental and somatic dysfunction of cervical region: Secondary | ICD-10-CM | POA: Diagnosis not present

## 2017-07-10 DIAGNOSIS — M9902 Segmental and somatic dysfunction of thoracic region: Secondary | ICD-10-CM | POA: Diagnosis not present

## 2017-07-10 DIAGNOSIS — M47812 Spondylosis without myelopathy or radiculopathy, cervical region: Secondary | ICD-10-CM | POA: Diagnosis not present

## 2017-07-10 DIAGNOSIS — M4716 Other spondylosis with myelopathy, lumbar region: Secondary | ICD-10-CM | POA: Diagnosis not present

## 2017-08-08 DIAGNOSIS — M4716 Other spondylosis with myelopathy, lumbar region: Secondary | ICD-10-CM | POA: Diagnosis not present

## 2017-08-08 DIAGNOSIS — M9902 Segmental and somatic dysfunction of thoracic region: Secondary | ICD-10-CM | POA: Diagnosis not present

## 2017-08-08 DIAGNOSIS — M47812 Spondylosis without myelopathy or radiculopathy, cervical region: Secondary | ICD-10-CM | POA: Diagnosis not present

## 2017-08-08 DIAGNOSIS — M9901 Segmental and somatic dysfunction of cervical region: Secondary | ICD-10-CM | POA: Diagnosis not present

## 2017-08-28 DIAGNOSIS — Z125 Encounter for screening for malignant neoplasm of prostate: Secondary | ICD-10-CM | POA: Diagnosis not present

## 2017-08-28 DIAGNOSIS — Z0000 Encounter for general adult medical examination without abnormal findings: Secondary | ICD-10-CM | POA: Diagnosis not present

## 2017-08-28 DIAGNOSIS — R739 Hyperglycemia, unspecified: Secondary | ICD-10-CM | POA: Diagnosis not present

## 2017-08-28 DIAGNOSIS — K219 Gastro-esophageal reflux disease without esophagitis: Secondary | ICD-10-CM | POA: Diagnosis not present

## 2017-08-28 DIAGNOSIS — E785 Hyperlipidemia, unspecified: Secondary | ICD-10-CM | POA: Diagnosis not present

## 2017-09-05 DIAGNOSIS — M47812 Spondylosis without myelopathy or radiculopathy, cervical region: Secondary | ICD-10-CM | POA: Diagnosis not present

## 2017-09-05 DIAGNOSIS — M9902 Segmental and somatic dysfunction of thoracic region: Secondary | ICD-10-CM | POA: Diagnosis not present

## 2017-09-05 DIAGNOSIS — M4716 Other spondylosis with myelopathy, lumbar region: Secondary | ICD-10-CM | POA: Diagnosis not present

## 2017-09-05 DIAGNOSIS — M9901 Segmental and somatic dysfunction of cervical region: Secondary | ICD-10-CM | POA: Diagnosis not present

## 2017-10-08 DIAGNOSIS — K080 Exfoliation of teeth due to systemic causes: Secondary | ICD-10-CM | POA: Diagnosis not present

## 2017-10-09 DIAGNOSIS — M47812 Spondylosis without myelopathy or radiculopathy, cervical region: Secondary | ICD-10-CM | POA: Diagnosis not present

## 2017-10-09 DIAGNOSIS — K080 Exfoliation of teeth due to systemic causes: Secondary | ICD-10-CM | POA: Diagnosis not present

## 2017-10-09 DIAGNOSIS — M4716 Other spondylosis with myelopathy, lumbar region: Secondary | ICD-10-CM | POA: Diagnosis not present

## 2017-10-09 DIAGNOSIS — M9902 Segmental and somatic dysfunction of thoracic region: Secondary | ICD-10-CM | POA: Diagnosis not present

## 2017-10-09 DIAGNOSIS — M9901 Segmental and somatic dysfunction of cervical region: Secondary | ICD-10-CM | POA: Diagnosis not present

## 2017-10-29 DIAGNOSIS — L814 Other melanin hyperpigmentation: Secondary | ICD-10-CM | POA: Diagnosis not present

## 2017-10-29 DIAGNOSIS — D225 Melanocytic nevi of trunk: Secondary | ICD-10-CM | POA: Diagnosis not present

## 2017-10-29 DIAGNOSIS — D1801 Hemangioma of skin and subcutaneous tissue: Secondary | ICD-10-CM | POA: Diagnosis not present

## 2017-10-29 DIAGNOSIS — L820 Inflamed seborrheic keratosis: Secondary | ICD-10-CM | POA: Diagnosis not present

## 2017-10-29 DIAGNOSIS — R202 Paresthesia of skin: Secondary | ICD-10-CM | POA: Diagnosis not present

## 2017-11-13 DIAGNOSIS — M47812 Spondylosis without myelopathy or radiculopathy, cervical region: Secondary | ICD-10-CM | POA: Diagnosis not present

## 2017-11-13 DIAGNOSIS — M4716 Other spondylosis with myelopathy, lumbar region: Secondary | ICD-10-CM | POA: Diagnosis not present

## 2017-11-13 DIAGNOSIS — M9902 Segmental and somatic dysfunction of thoracic region: Secondary | ICD-10-CM | POA: Diagnosis not present

## 2017-11-13 DIAGNOSIS — M9901 Segmental and somatic dysfunction of cervical region: Secondary | ICD-10-CM | POA: Diagnosis not present

## 2018-03-14 IMAGING — CR DG ANKLE COMPLETE 3+V*R*
3 series · 3 of 3 positions shown · non-contrast
Comparison: None.

CLINICAL DATA: Patient with ankle injury on the tailgate of a
trailer. Initial encounter.

EXAM:
RIGHT ANKLE - COMPLETE 3+ VIEW

[x ankle lat right]
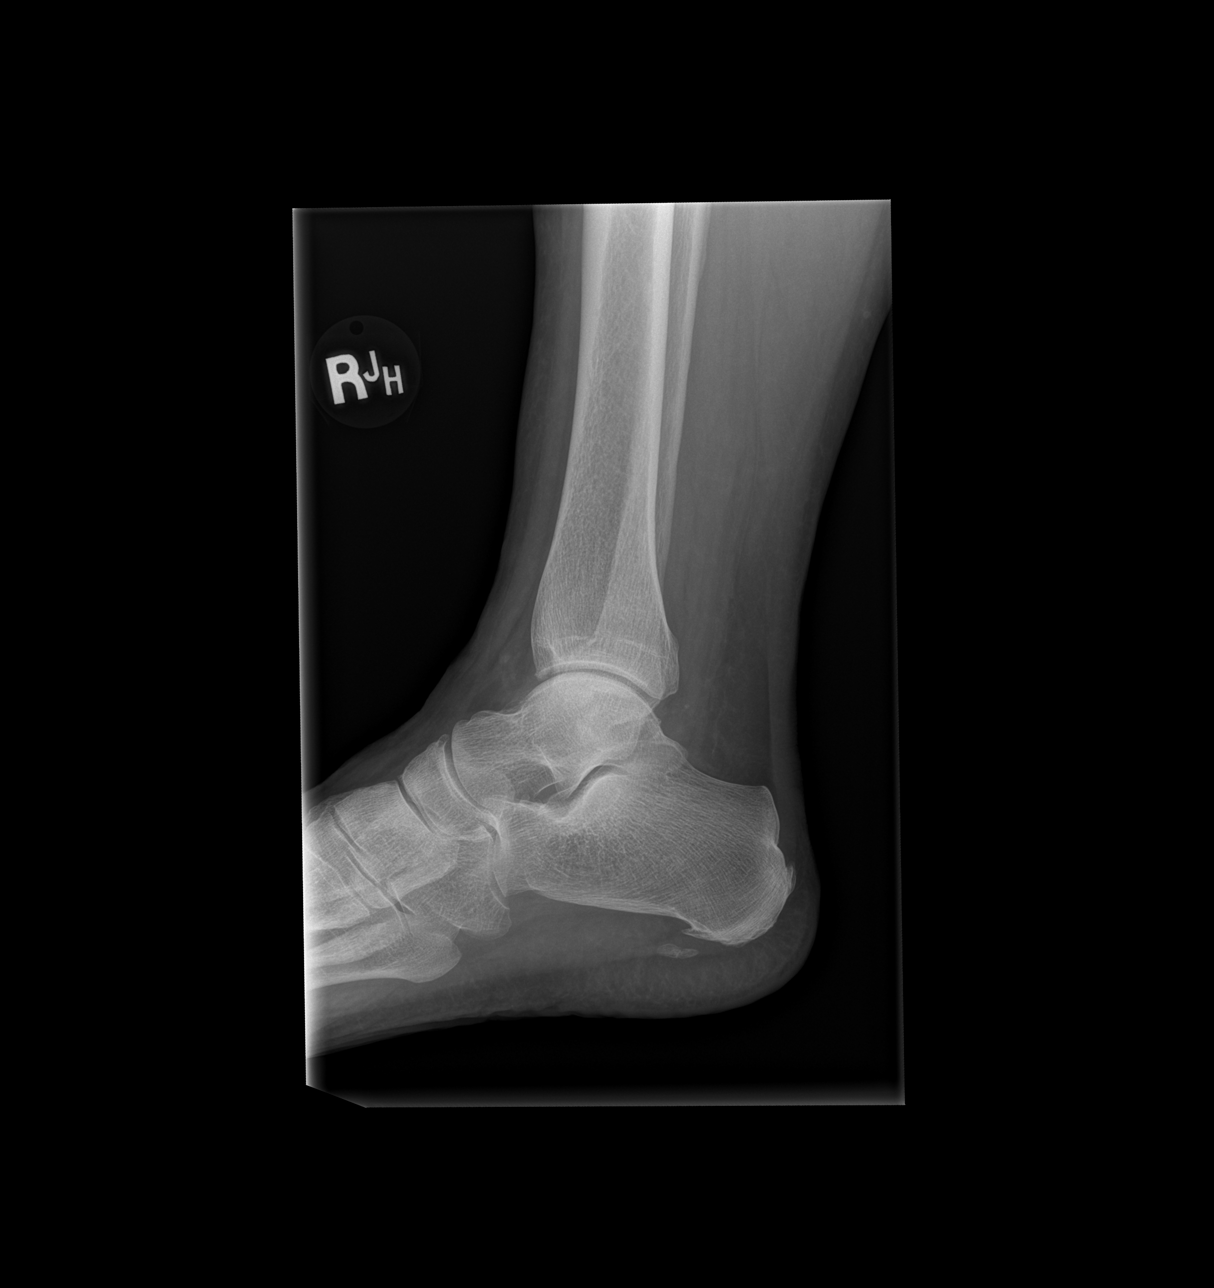

[x ankle ap right]
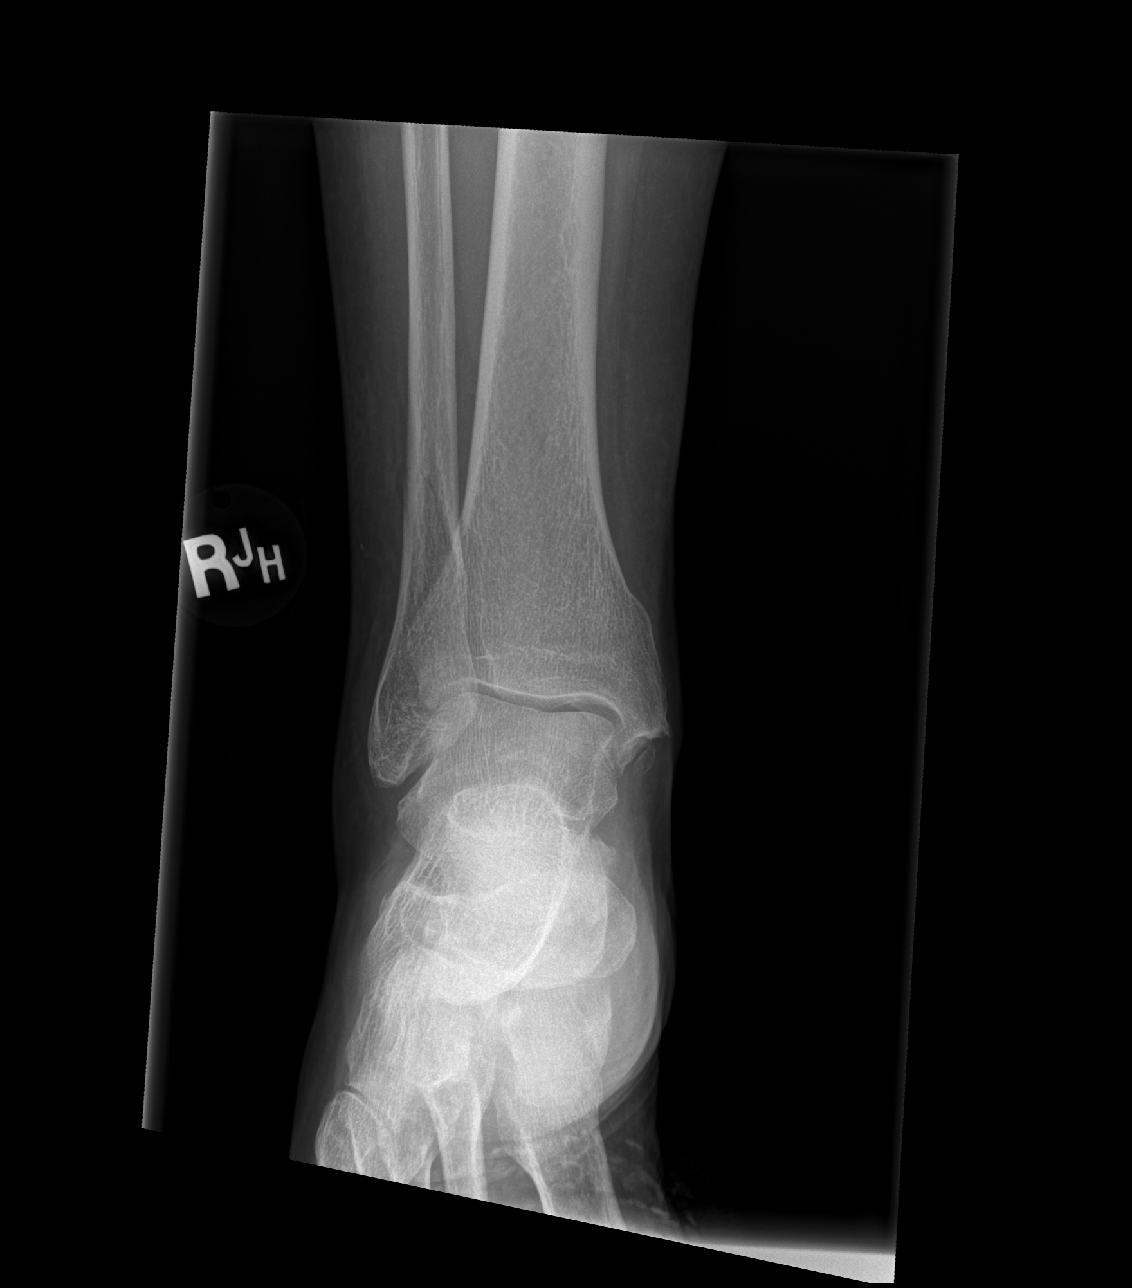

[x ankle obl right]
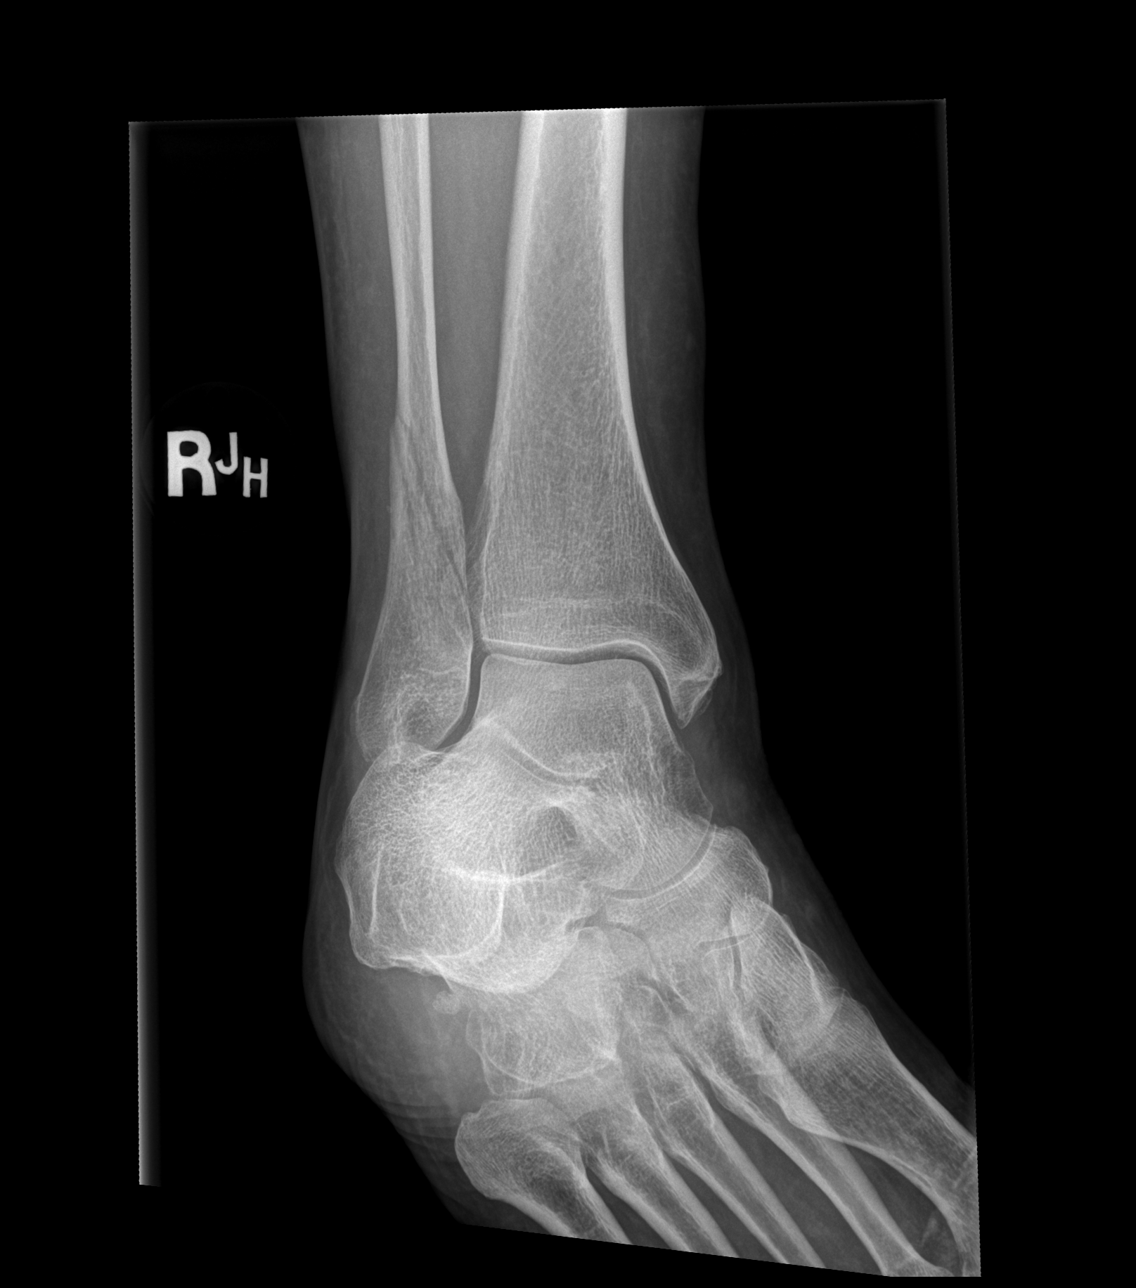

[3 of 3 positions shown; findings below may reference images not displayed]

FINDINGS: There is a nondisplaced oblique fracture through the distal fibular
diaphysis. Mild overlying soft tissue swelling. Corticated ossific
density about the inferior aspect of the medial malleolus likely
sequelae of prior trauma. Talar dome is intact. Midfoot degenerative
changes. Plantar calcaneal spurring.
IMPRESSION: Nondisplaced oblique fracture through the the distal fibular
diaphysis.

Sequelae of prior injury about the medial malleolus.

Degenerative changes.

## 2018-10-09 DIAGNOSIS — Z86010 Personal history of colonic polyps: Secondary | ICD-10-CM | POA: Diagnosis not present

## 2018-10-09 DIAGNOSIS — R1900 Intra-abdominal and pelvic swelling, mass and lump, unspecified site: Secondary | ICD-10-CM | POA: Diagnosis not present

## 2018-10-09 DIAGNOSIS — M542 Cervicalgia: Secondary | ICD-10-CM | POA: Diagnosis not present

## 2018-10-09 DIAGNOSIS — M79643 Pain in unspecified hand: Secondary | ICD-10-CM | POA: Diagnosis not present

## 2019-04-07 DIAGNOSIS — K080 Exfoliation of teeth due to systemic causes: Secondary | ICD-10-CM | POA: Diagnosis not present

## 2019-04-30 DIAGNOSIS — K7581 Nonalcoholic steatohepatitis (NASH): Secondary | ICD-10-CM | POA: Diagnosis not present

## 2019-04-30 DIAGNOSIS — G4733 Obstructive sleep apnea (adult) (pediatric): Secondary | ICD-10-CM | POA: Diagnosis not present

## 2019-04-30 DIAGNOSIS — K219 Gastro-esophageal reflux disease without esophagitis: Secondary | ICD-10-CM | POA: Diagnosis not present

## 2019-04-30 DIAGNOSIS — E785 Hyperlipidemia, unspecified: Secondary | ICD-10-CM | POA: Diagnosis not present

## 2019-05-14 DIAGNOSIS — Z8249 Family history of ischemic heart disease and other diseases of the circulatory system: Secondary | ICD-10-CM | POA: Diagnosis not present

## 2019-05-14 DIAGNOSIS — R7301 Impaired fasting glucose: Secondary | ICD-10-CM | POA: Diagnosis not present

## 2019-05-14 DIAGNOSIS — R972 Elevated prostate specific antigen [PSA]: Secondary | ICD-10-CM | POA: Diagnosis not present

## 2019-05-14 DIAGNOSIS — E785 Hyperlipidemia, unspecified: Secondary | ICD-10-CM | POA: Diagnosis not present

## 2019-08-31 DIAGNOSIS — L814 Other melanin hyperpigmentation: Secondary | ICD-10-CM | POA: Diagnosis not present

## 2019-08-31 DIAGNOSIS — D485 Neoplasm of uncertain behavior of skin: Secondary | ICD-10-CM | POA: Diagnosis not present

## 2019-08-31 DIAGNOSIS — D1801 Hemangioma of skin and subcutaneous tissue: Secondary | ICD-10-CM | POA: Diagnosis not present

## 2019-08-31 DIAGNOSIS — L821 Other seborrheic keratosis: Secondary | ICD-10-CM | POA: Diagnosis not present

## 2019-08-31 DIAGNOSIS — L570 Actinic keratosis: Secondary | ICD-10-CM | POA: Diagnosis not present

## 2019-08-31 DIAGNOSIS — D225 Melanocytic nevi of trunk: Secondary | ICD-10-CM | POA: Diagnosis not present

## 2019-08-31 DIAGNOSIS — L723 Sebaceous cyst: Secondary | ICD-10-CM | POA: Diagnosis not present

## 2019-12-03 DIAGNOSIS — J0100 Acute maxillary sinusitis, unspecified: Secondary | ICD-10-CM | POA: Diagnosis not present

## 2019-12-03 DIAGNOSIS — H66001 Acute suppurative otitis media without spontaneous rupture of ear drum, right ear: Secondary | ICD-10-CM | POA: Diagnosis not present

## 2019-12-10 ENCOUNTER — Inpatient Hospital Stay: Admission: RE | Admit: 2019-12-10 | Payer: Federal, State, Local not specified - PPO | Source: Ambulatory Visit

## 2019-12-16 ENCOUNTER — Other Ambulatory Visit: Admission: RE | Admit: 2019-12-16 | Payer: Federal, State, Local not specified - PPO | Source: Ambulatory Visit

## 2019-12-18 ENCOUNTER — Ambulatory Visit: Admit: 2019-12-18 | Payer: Federal, State, Local not specified - PPO

## 2019-12-18 SURGERY — VIDEO BRONCHOSCOPY WITH ENDOBRONCHIAL NAVIGATION
Anesthesia: General

## 2020-01-26 DIAGNOSIS — Z005 Encounter for examination of potential donor of organ and tissue: Secondary | ICD-10-CM | POA: Diagnosis not present

## 2020-04-04 DIAGNOSIS — R972 Elevated prostate specific antigen [PSA]: Secondary | ICD-10-CM | POA: Diagnosis not present

## 2020-04-04 DIAGNOSIS — N401 Enlarged prostate with lower urinary tract symptoms: Secondary | ICD-10-CM | POA: Diagnosis not present

## 2020-04-04 DIAGNOSIS — R3912 Poor urinary stream: Secondary | ICD-10-CM | POA: Diagnosis not present

## 2020-04-14 DIAGNOSIS — R066 Hiccough: Secondary | ICD-10-CM | POA: Diagnosis not present

## 2020-04-14 DIAGNOSIS — K219 Gastro-esophageal reflux disease without esophagitis: Secondary | ICD-10-CM | POA: Diagnosis not present

## 2020-04-20 ENCOUNTER — Other Ambulatory Visit: Payer: Self-pay | Admitting: Urology

## 2020-04-20 DIAGNOSIS — R972 Elevated prostate specific antigen [PSA]: Secondary | ICD-10-CM

## 2020-05-11 ENCOUNTER — Other Ambulatory Visit: Payer: Self-pay | Admitting: Urology

## 2020-05-11 DIAGNOSIS — R972 Elevated prostate specific antigen [PSA]: Secondary | ICD-10-CM

## 2020-06-03 ENCOUNTER — Other Ambulatory Visit: Payer: Self-pay

## 2020-06-03 ENCOUNTER — Ambulatory Visit
Admission: RE | Admit: 2020-06-03 | Discharge: 2020-06-03 | Disposition: A | Discharge status: Home / Self Care | Payer: Federal, State, Local not specified - PPO | Source: Ambulatory Visit | Attending: Urology | Admitting: Urology

## 2020-06-03 DIAGNOSIS — R972 Elevated prostate specific antigen [PSA]: Secondary | ICD-10-CM

## 2020-06-03 DIAGNOSIS — Z01818 Encounter for other preprocedural examination: Secondary | ICD-10-CM | POA: Diagnosis not present

## 2020-06-03 MED ORDER — GADOBENATE DIMEGLUMINE 529 MG/ML IV SOLN
18.0000 mL | Freq: Once | INTRAVENOUS | Status: AC | PRN
  Administered 2020-06-03: 18 mL via INTRAVENOUS

## 2020-06-07 DIAGNOSIS — R972 Elevated prostate specific antigen [PSA]: Secondary | ICD-10-CM | POA: Diagnosis not present

## 2020-06-07 DIAGNOSIS — R3912 Poor urinary stream: Secondary | ICD-10-CM | POA: Diagnosis not present

## 2020-06-07 DIAGNOSIS — N401 Enlarged prostate with lower urinary tract symptoms: Secondary | ICD-10-CM | POA: Diagnosis not present

## 2020-06-15 DIAGNOSIS — R972 Elevated prostate specific antigen [PSA]: Secondary | ICD-10-CM | POA: Diagnosis not present

## 2020-09-13 DIAGNOSIS — Z01818 Encounter for other preprocedural examination: Secondary | ICD-10-CM | POA: Diagnosis not present

## 2020-09-13 DIAGNOSIS — N186 End stage renal disease: Secondary | ICD-10-CM | POA: Diagnosis not present

## 2020-12-12 DIAGNOSIS — R3912 Poor urinary stream: Secondary | ICD-10-CM | POA: Diagnosis not present

## 2020-12-12 DIAGNOSIS — N401 Enlarged prostate with lower urinary tract symptoms: Secondary | ICD-10-CM | POA: Diagnosis not present

## 2020-12-16 DIAGNOSIS — R3912 Poor urinary stream: Secondary | ICD-10-CM | POA: Diagnosis not present

## 2020-12-16 DIAGNOSIS — R972 Elevated prostate specific antigen [PSA]: Secondary | ICD-10-CM | POA: Diagnosis not present

## 2020-12-16 DIAGNOSIS — N401 Enlarged prostate with lower urinary tract symptoms: Secondary | ICD-10-CM | POA: Diagnosis not present

## 2020-12-17 DIAGNOSIS — U071 COVID-19: Secondary | ICD-10-CM | POA: Diagnosis not present

## 2020-12-17 DIAGNOSIS — J00 Acute nasopharyngitis [common cold]: Secondary | ICD-10-CM | POA: Diagnosis not present

## 2020-12-17 DIAGNOSIS — R0981 Nasal congestion: Secondary | ICD-10-CM | POA: Diagnosis not present

## 2021-02-02 DIAGNOSIS — I10 Essential (primary) hypertension: Secondary | ICD-10-CM | POA: Diagnosis not present

## 2021-02-02 DIAGNOSIS — U071 COVID-19: Secondary | ICD-10-CM | POA: Diagnosis not present

## 2021-02-02 DIAGNOSIS — R972 Elevated prostate specific antigen [PSA]: Secondary | ICD-10-CM | POA: Diagnosis not present

## 2021-02-02 DIAGNOSIS — Z1329 Encounter for screening for other suspected endocrine disorder: Secondary | ICD-10-CM | POA: Diagnosis not present

## 2021-02-02 DIAGNOSIS — E559 Vitamin D deficiency, unspecified: Secondary | ICD-10-CM | POA: Diagnosis not present

## 2021-02-02 DIAGNOSIS — E7849 Other hyperlipidemia: Secondary | ICD-10-CM | POA: Diagnosis not present

## 2021-02-02 DIAGNOSIS — L719 Rosacea, unspecified: Secondary | ICD-10-CM | POA: Diagnosis not present

## 2021-02-23 DIAGNOSIS — D171 Benign lipomatous neoplasm of skin and subcutaneous tissue of trunk: Secondary | ICD-10-CM | POA: Diagnosis not present

## 2021-02-23 DIAGNOSIS — M720 Palmar fascial fibromatosis [Dupuytren]: Secondary | ICD-10-CM | POA: Diagnosis not present

## 2021-02-23 DIAGNOSIS — E7889 Other lipoprotein metabolism disorders: Secondary | ICD-10-CM | POA: Diagnosis not present

## 2021-02-23 DIAGNOSIS — Z0001 Encounter for general adult medical examination with abnormal findings: Secondary | ICD-10-CM | POA: Diagnosis not present

## 2021-04-05 DIAGNOSIS — J22 Unspecified acute lower respiratory infection: Secondary | ICD-10-CM | POA: Diagnosis not present

## 2021-05-25 DIAGNOSIS — K080 Exfoliation of teeth due to systemic causes: Secondary | ICD-10-CM | POA: Diagnosis not present

## 2021-06-09 DIAGNOSIS — R208 Other disturbances of skin sensation: Secondary | ICD-10-CM | POA: Diagnosis not present

## 2021-06-09 DIAGNOSIS — L817 Pigmented purpuric dermatosis: Secondary | ICD-10-CM | POA: Diagnosis not present

## 2021-06-09 DIAGNOSIS — L821 Other seborrheic keratosis: Secondary | ICD-10-CM | POA: Diagnosis not present

## 2021-06-09 DIAGNOSIS — L298 Other pruritus: Secondary | ICD-10-CM | POA: Diagnosis not present

## 2021-06-09 DIAGNOSIS — D225 Melanocytic nevi of trunk: Secondary | ICD-10-CM | POA: Diagnosis not present

## 2021-06-09 DIAGNOSIS — L814 Other melanin hyperpigmentation: Secondary | ICD-10-CM | POA: Diagnosis not present

## 2021-06-09 DIAGNOSIS — R58 Hemorrhage, not elsewhere classified: Secondary | ICD-10-CM | POA: Diagnosis not present

## 2021-06-09 DIAGNOSIS — L538 Other specified erythematous conditions: Secondary | ICD-10-CM | POA: Diagnosis not present

## 2021-06-09 DIAGNOSIS — D485 Neoplasm of uncertain behavior of skin: Secondary | ICD-10-CM | POA: Diagnosis not present

## 2021-06-09 DIAGNOSIS — L570 Actinic keratosis: Secondary | ICD-10-CM | POA: Diagnosis not present

## 2021-06-09 DIAGNOSIS — D1801 Hemangioma of skin and subcutaneous tissue: Secondary | ICD-10-CM | POA: Diagnosis not present

## 2021-06-09 DIAGNOSIS — L820 Inflamed seborrheic keratosis: Secondary | ICD-10-CM | POA: Diagnosis not present

## 2021-06-12 DIAGNOSIS — R3912 Poor urinary stream: Secondary | ICD-10-CM | POA: Diagnosis not present

## 2021-06-12 DIAGNOSIS — N401 Enlarged prostate with lower urinary tract symptoms: Secondary | ICD-10-CM | POA: Diagnosis not present

## 2021-06-19 DIAGNOSIS — R972 Elevated prostate specific antigen [PSA]: Secondary | ICD-10-CM | POA: Diagnosis not present

## 2021-06-19 DIAGNOSIS — R3121 Asymptomatic microscopic hematuria: Secondary | ICD-10-CM | POA: Diagnosis not present

## 2021-06-19 DIAGNOSIS — N401 Enlarged prostate with lower urinary tract symptoms: Secondary | ICD-10-CM | POA: Diagnosis not present

## 2021-06-19 DIAGNOSIS — R3912 Poor urinary stream: Secondary | ICD-10-CM | POA: Diagnosis not present

## 2021-06-22 DIAGNOSIS — N210 Calculus in bladder: Secondary | ICD-10-CM | POA: Diagnosis not present

## 2021-06-22 DIAGNOSIS — K8020 Calculus of gallbladder without cholecystitis without obstruction: Secondary | ICD-10-CM | POA: Diagnosis not present

## 2021-06-22 DIAGNOSIS — R3121 Asymptomatic microscopic hematuria: Secondary | ICD-10-CM | POA: Diagnosis not present

## 2021-06-22 DIAGNOSIS — N3289 Other specified disorders of bladder: Secondary | ICD-10-CM | POA: Diagnosis not present

## 2021-06-22 DIAGNOSIS — R3129 Other microscopic hematuria: Secondary | ICD-10-CM | POA: Diagnosis not present

## 2021-07-05 DIAGNOSIS — R3912 Poor urinary stream: Secondary | ICD-10-CM | POA: Diagnosis not present

## 2021-07-05 DIAGNOSIS — R972 Elevated prostate specific antigen [PSA]: Secondary | ICD-10-CM | POA: Diagnosis not present

## 2021-07-05 DIAGNOSIS — N401 Enlarged prostate with lower urinary tract symptoms: Secondary | ICD-10-CM | POA: Diagnosis not present

## 2021-07-05 DIAGNOSIS — R3121 Asymptomatic microscopic hematuria: Secondary | ICD-10-CM | POA: Diagnosis not present

## 2021-08-03 DIAGNOSIS — E569 Vitamin deficiency, unspecified: Secondary | ICD-10-CM | POA: Diagnosis not present

## 2021-08-03 DIAGNOSIS — I10 Essential (primary) hypertension: Secondary | ICD-10-CM | POA: Diagnosis not present

## 2021-08-03 DIAGNOSIS — E785 Hyperlipidemia, unspecified: Secondary | ICD-10-CM | POA: Diagnosis not present

## 2021-08-03 DIAGNOSIS — R0609 Other forms of dyspnea: Secondary | ICD-10-CM | POA: Diagnosis not present

## 2021-08-03 DIAGNOSIS — R972 Elevated prostate specific antigen [PSA]: Secondary | ICD-10-CM | POA: Diagnosis not present

## 2021-08-03 DIAGNOSIS — E291 Testicular hypofunction: Secondary | ICD-10-CM | POA: Diagnosis not present

## 2022-04-29 NOTE — Progress Notes (Signed)
Cardiology Office Note:    Date:  04/30/2022   ID:  Blake Diaz, DOB 1955/11/20, MRN [id number]  PCP:  Patient, No Pcp Per  Cardiologist:  None   Referring MD: No ref. provider found   Chief Complaint  Patient presents with   Advice Only    Lower extremity swelling   Hypertension   Hyperlipidemia   Coronary Artery Disease    History of Present Illness:    Blake Diaz is a 66 y.o. male with a hx of OSA, hyperlipidemia, restritive lung disease,  hypertension, prior smoker, family history of CAD, h/o chest pain, now with h/o lower extremity swelling.  Prior cardiac work-up October 2016 because of chest pain.  No significant obstructive disease was identified.  See cath report below.  Hemodynamics and echocardiogram were also normal with the exception of "mild RV enlargement".  He tells wife are here because after having COVID-19 twice in the past 3 years, he has developed a cough, dyspnea on exertion, and worried that he has heart failure.  He did have PFTs performed at [facility].  He was told that he has some mild restriction.  Bronchodilator therapy was recommended and he says it caused him to feel worse.  Coronary angiography performed by me 8 years ago revealed minimal coronary atherosclerosis.  He was started gemfibrozil by his primary physician.  After starting the medications listed above, he felt so much worse that he is totally discontinued them.  The wife has been noticing bilateral lower extremity swelling, left greater than right.  This concerned her for the presence of heart failure.  A recent echocardiogram done at [facility] was normal.  Past Medical History:  Diagnosis Date   Arthritis    osteoarthritis right knee   DOE (dyspnea on exertion)    Elevated liver enzymes    history of "never any continued follow up"-"not a problem"   GERD (gastroesophageal reflux disease)    acid reflux uses OTS "TUMS"   Hypertension    presently taking Lisinopril  sparingly   Sleep apnea    nasal prongs, cpap used occ.    Past Surgical History:  Procedure Laterality Date   CARDIAC CATHETERIZATION N/A 05/12/2015   Procedure: Left Heart Cath and Coronary Angiography;  Surgeon: Belva Crome, MD;  Location: [place] [facility];  Service: Cardiovascular;  Laterality: N/A;   HAND SURGERY Left    trigger finger   HERNIA REPAIR     Umbilical, bilateral inguinal hernis repair   SHOULDER SURGERY Right    open -bone spur and ligament tears"some limited ROM"   TOTAL KNEE ARTHROPLASTY Right 11/08/2014   Procedure: RIGHT TOTAL KNEE ARTHROPLASTY;  Surgeon: Paralee Cancel, MD;  Location: [facility];  Service: Orthopedics;  Laterality: Right;    Current Medications: Current Meds  Medication Sig   Cholecalciferol (VITAMIN D-1000 MAX ST) 25 MCG (1000 UT) tablet Take 1 tablet by mouth daily at 6 (six) AM.   desonide (DESOWEN) 0.05 % cream Apply 1 application topically 2 (two) times daily as needed (Itching).    VITAMIN E PO Take 1 capsule by mouth daily at 12 noon.   zinc sulfate 220 (50 Zn) MG capsule Take 1 capsule by mouth daily.     Allergies:   Augmentin [amoxicillin-pot clavulanate]   Social History   Socioeconomic History   Marital status: Married    Spouse name: Not on file   Number of children: Not on file   Years of education: Not on file  Highest education level: Not on file  Occupational History   Not on file  Tobacco Use   Smoking status: Former    Types: Cigarettes    Quit date: 10/27/1994    Years since quitting: 27.5   Smokeless tobacco: Not on file  Substance and Sexual Activity   Alcohol use: No   Drug use: No   Sexual activity: Not on file  Other Topics Concern   Not on file  Social History Narrative   Not on file   Social Determinants of Health   Financial Resource Strain: Not on file  Food Insecurity: Not on file  Transportation Needs: Not on file  Physical Activity: Not on file  Stress: Not on file  Social Connections:  Not on file     Family History: The patient's family history includes Healthy in his daughter and son; Heart disease in his brother; Hypertension in his mother; Other in his mother.  ROS:   Please see the history of present illness.    They reveal that after starting bronchodilator therapy, he felt much worse.  This led him to discontinue all medications including gemfibrozil and the pulmonary treatments.  Now he states he feels somewhat better.  He feels his endurance is less over the last year or so than he would expect at age 43.  All other systems reviewed and are negative.  EKGs/Labs/Other Studies Reviewed:    The following studies were reviewed today: Cardiac catheterization October 2016: Widely patent coronary arteries with no evidence of obstructive coronary disease. Normal left ventricular systolic function. Estimated ejection fraction 60% AV nodal artery is tortuous and in one view as the appearance of interatrial septal staining. Uncertain significance.  2D Doppler echocardiogram October 2016: Study Conclusions   - Left ventricle: The cavity size was normal. Systolic function was    normal. The estimated ejection fraction was in the range of 60%    to 65%. Wall motion was normal; there were no regional wall    motion abnormalities.  - Mitral valve: Transvalvular velocity was within the normal range.    There was no evidence for stenosis. There was trivial    regurgitation.  - Right ventricle: The cavity size was mildly dilated. Wall    thickness was normal. Systolic function was normal.  - Atrial septum: No defect or patent foramen ovale was identified.  - Tricuspid valve: There was trivial regurgitation.  - Pulmonary arteries: PA peak pressure: 28 mm Hg (S).  - Inferior vena cava: The vessel was normal in size. The    respirophasic diameter changes were in the normal range (= 50%),    consistent with normal central venous pressure.   2D Doppler echocardiogram August 15, 2021: SUMMARY  There is mild concentric left ventricular hypertrophy with normal wall  motion, normal systolic function and ejection fraction '55-60%'.  There is mild tricuspid regurgitation.  No pulmonary hypertension.  There is no comparison study available.    EKG:  EKG performed February 12, 2022 revealed normal sinus rhythm with normal appearance.  The tracing performed today demonstrates normal sinus rhythm with normal EKG appearance.  Recent Labs: No results found for requested labs within last 365 days.  Recent Lipid Panel No results found for: "CHOL", "TRIG", "HDL", "CHOLHDL", "VLDL", "LDLCALC", "LDLDIRECT"  Physical Exam:    VS:  BP 132/82   Pulse 64   Ht '5\' 10"'$  (1.778 m)   Wt 186 lb 3.2 oz (84.5 kg)   SpO2 95%   BMI  26.72 kg/m     Wt Readings from Last 3 Encounters:  04/30/22 186 lb 3.2 oz (84.5 kg)  05/12/15 194 lb (88 kg)  05/09/15 198 lb 6.4 oz (90 kg)     GEN: Bearded and minimally over the weight. No acute distress HEENT: Normal NECK: No JVD. LYMPHATICS: No lymphadenopathy CARDIAC: No murmur. RRR no gallop.  There is left greater than right pedal and ankle puffiness with trace pitting the left.  Multiple varicosities since superficial veins are noted in both lower extremities up to and beyond the level of the knee. VASCULAR:  Normal Pulses. No bruits. RESPIRATORY:  Clear to auscultation without rales, wheezing or rhonchi  ABDOMEN: Soft, non-tender, non-distended, No pulsatile mass, MUSCULOSKELETAL: No deformity  SKIN: Warm and dry NEUROLOGIC:  Alert and oriented x 3 PSYCHIATRIC:  Normal affect   ASSESSMENT:    1. Shortness of breath   2. OSA (obstructive sleep apnea)   3. Other hyperlipidemia   4. Coronary artery disease involving native coronary artery of native heart without angina pectoris   5. Essential hypertension   6. Leg swelling   7. Cough, unspecified type    PLAN:    In order of problems listed above:  Certain explanation, possibly  related to 2 prior episodes of COVID-19.  No evidence of heart failure.  To be sure, we will check a brain natruretic peptide. Did not discuss Lipid panel will be obtained.  He does have mild coronary atherosclerosis noted on cardiac catheterization 2016. Secondary prevention discussed Blood pressure control is adequate on current regimen which includes no antihypertensive therapy at all. Lower extremity swelling without evidence of heart failure or venous pressure elevation and with lower extremity findings of varicosities, consistent with venous insufficiency. Not felt to be due to heart failure.  Brain natruretic peptide will be obtained.  Overall education and awareness concerning primary/secondary risk prevention was discussed in detail: LDL less than 70, hemoglobin A1c less than 7, blood pressure target less than 130/80 mmHg, >150 minutes of moderate aerobic activity per week, avoidance of smoking, weight control (via diet and exercise), and continued surveillance/management of/for obstructive sleep apnea.    Medication Adjustments/Labs and Tests Ordered: Current medicines are reviewed at length with the patient today.  Concerns regarding medicines are outlined above.  Orders Placed This Encounter  Procedures   Lipid panel   Pro b natriuretic peptide (BNP)   EKG 12-Lead   No orders of the defined types were placed in this encounter.   Patient Instructions  Medication Instructions:  Your physician recommends that you continue on your current medications as directed. Please refer to the Current Medication list given to you today.  *If you need a refill on your cardiac medications before your next appointment, please call your pharmacy*  Lab Work: Lipids, BNP If you have labs (blood work) drawn today and your tests are completely normal, you will receive your results only by: [place] (if you have MyChart) OR A paper copy in the mail If you have any lab test that is abnormal  or we need to change your treatment, we will call you to review the results.  Follow-Up: At [facility], you and your health needs are our priority.  As part of our continuing mission to provide you with exceptional heart care, we have created designated Provider Care Teams.  These Care Teams include your primary Cardiologist (physician) and Advanced Practice Providers (APPs -  Physician Assistants and Nurse Practitioners) who all work together  to provide you with the care you need, when you need it.  We recommend signing up for the patient portal called "MyChart".  Sign up information is provided on this After Visit Summary.  MyChart is used to connect with patients for Virtual Visits (Telemedicine).  Patients are able to view lab/test results, encounter notes, upcoming appointments, etc.  Non-urgent messages can be sent to your provider as well.   To learn more about what you can do with MyChart, go to [email or web address].    Your next appointment:   As needed   The format for your next appointment:   In Person  Provider:               Signed, Sinclair Grooms, MD  04/30/2022 12:45 PM    [place]

## 2022-04-30 ENCOUNTER — Ambulatory Visit
Payer: Federal, State, Local not specified - PPO | Attending: Interventional Cardiology | Admitting: Interventional Cardiology

## 2022-04-30 ENCOUNTER — Encounter: Payer: Self-pay | Admitting: Interventional Cardiology

## 2022-04-30 VITALS — BP 132/82 | HR 64 | Ht 70.0 in | Wt 186.2 lb

## 2022-04-30 DIAGNOSIS — I2510 Atherosclerotic heart disease of native coronary artery without angina pectoris: Secondary | ICD-10-CM | POA: Diagnosis not present

## 2022-04-30 DIAGNOSIS — M7989 Other specified soft tissue disorders: Secondary | ICD-10-CM

## 2022-04-30 DIAGNOSIS — I10 Essential (primary) hypertension: Secondary | ICD-10-CM

## 2022-04-30 DIAGNOSIS — G4733 Obstructive sleep apnea (adult) (pediatric): Secondary | ICD-10-CM

## 2022-04-30 DIAGNOSIS — R059 Cough, unspecified: Secondary | ICD-10-CM

## 2022-04-30 DIAGNOSIS — R0602 Shortness of breath: Secondary | ICD-10-CM | POA: Diagnosis not present

## 2022-04-30 DIAGNOSIS — E7849 Other hyperlipidemia: Secondary | ICD-10-CM

## 2022-04-30 NOTE — Patient Instructions (Signed)
Medication Instructions:  Your physician recommends that you continue on your current medications as directed. Please refer to the Current Medication list given to you today.  *If you need a refill on your cardiac medications before your next appointment, please call your pharmacy*  Lab Work: Lipids, BNP If you have labs (blood work) drawn today and your tests are completely normal, you will receive your results only by: [place] (if you have MyChart) OR A paper copy in the mail If you have any lab test that is abnormal or we need to change your treatment, we will call you to review the results.  Follow-Up: At [facility], you and your health needs are our priority.  As part of our continuing mission to provide you with exceptional heart care, we have created designated Provider Care Teams.  These Care Teams include your primary Cardiologist (physician) and Advanced Practice Providers (APPs -  Physician Assistants and Nurse Practitioners) who all work together to provide you with the care you need, when you need it.  We recommend signing up for the patient portal called "MyChart".  Sign up information is provided on this After Visit Summary.  MyChart is used to connect with patients for Virtual Visits (Telemedicine).  Patients are able to view lab/test results, encounter notes, upcoming appointments, etc.  Non-urgent messages can be sent to your provider as well.   To learn more about what you can do with MyChart, go to [email or web address].    Your next appointment:   As needed   The format for your next appointment:   In Person  Provider:

## 2022-05-01 LAB — LIPID PANEL
Chol/HDL Ratio: 5.7 ratio — ABNORMAL HIGH (ref 0.0–5.0)
Cholesterol, Total: 171 mg/dL (ref 100–199)
HDL: 30 mg/dL — ABNORMAL LOW (ref >39)
LDL Chol Calc (NIH): 93 mg/dL (ref 0–99)
Triglycerides: 286 mg/dL — ABNORMAL HIGH (ref 0–149)
VLDL Cholesterol Cal: 48 mg/dL — ABNORMAL HIGH (ref 5–40)

## 2022-05-01 LAB — PRO B NATRIURETIC PEPTIDE: NT-Pro BNP: 98 pg/mL (ref 0–376)

## 2022-05-04 ENCOUNTER — Telehealth: Payer: Self-pay

## 2022-05-04 MED ORDER — ICOSAPENT ETHYL 1 G PO CAPS: 2.0000 g | ORAL_CAPSULE | Freq: Two times a day (BID) | ORAL | 3 refills | Status: AC

## 2022-05-04 MED ORDER — ROSUVASTATIN CALCIUM 5 MG PO TABS: 5.0000 mg | ORAL_TABLET | ORAL | 3 refills | Status: DC

## 2022-05-04 NOTE — Telephone Encounter (Signed)
-----   Message from Belva Crome, MD sent at 05/03/2022  3:26 PM EDT ----- He should start rosuvastatin 5 mg Monday, Wednesday, and Friday. Vascepa 2 g p.o. twice daily for triglyceride.

## 2022-05-04 NOTE — Telephone Encounter (Signed)
Spoke with patient and discussed Dr. Thompson Caul recommendations: He should start rosuvastatin 5 mg Monday, Wednesday, and Friday. Vascepa 2 g p.o. twice daily for triglyceride.  Rosuvastatin '5mg'$  M/W/F and Vascepa 2g BID ordered and sent to pharmacy of choice.  Patient verbalized understanding of the above.

## 2022-05-29 ENCOUNTER — Other Ambulatory Visit: Payer: Self-pay | Admitting: Urology

## 2022-05-29 DIAGNOSIS — R972 Elevated prostate specific antigen [PSA]: Secondary | ICD-10-CM

## 2022-06-19 ENCOUNTER — Ambulatory Visit
Admission: RE | Admit: 2022-06-19 | Discharge: 2022-06-19 | Disposition: A | Discharge status: Home / Self Care | Payer: Federal, State, Local not specified - PPO | Source: Ambulatory Visit | Attending: Urology | Admitting: Urology

## 2022-06-19 DIAGNOSIS — R972 Elevated prostate specific antigen [PSA]: Secondary | ICD-10-CM

## 2022-06-19 MED ORDER — GADOPICLENOL 0.5 MMOL/ML IV SOLN
8.0000 mL | Freq: Once | INTRAVENOUS | Status: AC | PRN
  Administered 2022-06-19: 8 mL via INTRAVENOUS

## 2023-06-12 ENCOUNTER — Other Ambulatory Visit: Payer: Self-pay | Admitting: *Deleted

## 2023-06-12 MED ORDER — ROSUVASTATIN CALCIUM 5 MG PO TABS: 5.0000 mg | ORAL_TABLET | ORAL | 3 refills | Status: DC

## 2023-10-16 ENCOUNTER — Other Ambulatory Visit: Payer: Self-pay | Admitting: Internal Medicine
